# Patient Record
Sex: Female | Born: 1980 | Race: White | Hispanic: No | Marital: Married | Smoking: Former smoker
Health system: Southern US, Community
[De-identification: ages and names within clinical notes are randomized; demographics above are authoritative.]

## PROBLEM LIST (undated history)

## (undated) ENCOUNTER — Inpatient Hospital Stay (HOSPITAL_COMMUNITY): Payer: Self-pay

## (undated) DIAGNOSIS — R51 Headache: Secondary | ICD-10-CM

## (undated) DIAGNOSIS — K219 Gastro-esophageal reflux disease without esophagitis: Secondary | ICD-10-CM

## (undated) DIAGNOSIS — R519 Headache, unspecified: Secondary | ICD-10-CM

## (undated) DIAGNOSIS — F419 Anxiety disorder, unspecified: Secondary | ICD-10-CM

## (undated) DIAGNOSIS — Z8619 Personal history of other infectious and parasitic diseases: Secondary | ICD-10-CM

## (undated) DIAGNOSIS — F988 Other specified behavioral and emotional disorders with onset usually occurring in childhood and adolescence: Secondary | ICD-10-CM

## (undated) HISTORY — PX: HERNIA REPAIR: SHX51

## (undated) HISTORY — PX: THERAPEUTIC ABORTION: SHX798

## (undated) HISTORY — DX: Anxiety disorder, unspecified: F41.9

---

## 2002-01-05 ENCOUNTER — Emergency Department (HOSPITAL_COMMUNITY): Admission: EM | Admit: 2002-01-05 | Discharge: 2002-01-05 | Payer: Self-pay | Admitting: *Deleted

## 2002-08-23 ENCOUNTER — Encounter: Payer: Self-pay | Admitting: Emergency Medicine

## 2002-08-23 ENCOUNTER — Emergency Department (HOSPITAL_COMMUNITY): Admission: EM | Admit: 2002-08-23 | Discharge: 2002-08-23 | Payer: Self-pay | Admitting: Emergency Medicine

## 2003-04-16 ENCOUNTER — Emergency Department (HOSPITAL_COMMUNITY): Admission: EM | Admit: 2003-04-16 | Discharge: 2003-04-16 | Payer: Self-pay | Admitting: Emergency Medicine

## 2010-07-06 ENCOUNTER — Inpatient Hospital Stay (HOSPITAL_COMMUNITY): Admission: RE | Admit: 2010-07-06 | Discharge: 2010-07-09 | Payer: Self-pay | Admitting: Obstetrics and Gynecology

## 2010-11-28 ENCOUNTER — Inpatient Hospital Stay (HOSPITAL_COMMUNITY): Admission: AD | Admit: 2010-11-28 | Payer: Self-pay | Admitting: Obstetrics and Gynecology

## 2011-01-06 LAB — CBC
HCT: 30.2 % — ABNORMAL LOW (ref 36.0–46.0)
HCT: 37.7 % (ref 36.0–46.0)
Hemoglobin: 10.6 g/dL — ABNORMAL LOW (ref 12.0–15.0)
Hemoglobin: 13.3 g/dL (ref 12.0–15.0)
MCH: 34 pg (ref 26.0–34.0)
MCH: 34.1 pg — ABNORMAL HIGH (ref 26.0–34.0)
MCHC: 34.9 g/dL (ref 30.0–36.0)
MCHC: 35.2 g/dL (ref 30.0–36.0)
MCV: 96.8 fL (ref 78.0–100.0)
MCV: 97.5 fL (ref 78.0–100.0)
Platelets: 148 10*3/uL — ABNORMAL LOW (ref 150–400)
Platelets: 176 10*3/uL (ref 150–400)
RBC: 3.1 MIL/uL — ABNORMAL LOW (ref 3.87–5.11)
RBC: 3.89 MIL/uL (ref 3.87–5.11)
RDW: 13.2 % (ref 11.5–15.5)
RDW: 13.3 % (ref 11.5–15.5)
WBC: 10.8 10*3/uL — ABNORMAL HIGH (ref 4.0–10.5)
WBC: 11.3 10*3/uL — ABNORMAL HIGH (ref 4.0–10.5)

## 2011-01-06 LAB — SURGICAL PCR SCREEN
MRSA, PCR: NEGATIVE
Staphylococcus aureus: NEGATIVE

## 2011-01-06 LAB — RPR: RPR Ser Ql: NONREACTIVE

## 2011-11-02 ENCOUNTER — Ambulatory Visit (INDEPENDENT_AMBULATORY_CARE_PROVIDER_SITE_OTHER): Payer: BC Managed Care – PPO | Admitting: Physician Assistant

## 2011-11-02 DIAGNOSIS — F909 Attention-deficit hyperactivity disorder, unspecified type: Secondary | ICD-10-CM

## 2011-11-02 DIAGNOSIS — G4700 Insomnia, unspecified: Secondary | ICD-10-CM

## 2011-12-10 ENCOUNTER — Telehealth: Payer: Self-pay

## 2011-12-10 MED ORDER — AMPHETAMINE-DEXTROAMPHETAMINE 15 MG PO TABS: 15.0000 mg | ORAL_TABLET | Freq: Two times a day (BID) | ORAL | Status: DC

## 2011-12-10 NOTE — Telephone Encounter (Signed)
Done. Ready to pick up.  Hailey [name]  

## 2011-12-10 NOTE — Telephone Encounter (Signed)
Pt is requesting a refill on adderol

## 2011-12-11 NOTE — Telephone Encounter (Signed)
LMOM rx ready to pick up

## 2012-01-17 ENCOUNTER — Telehealth: Payer: Self-pay

## 2012-01-17 NOTE — Telephone Encounter (Signed)
Pt requesting adderall refill   [facility]  Phone [phone]

## 2012-01-18 MED ORDER — AMPHETAMINE-DEXTROAMPHETAMINE 15 MG PO TABS: 15.0000 mg | ORAL_TABLET | Freq: Two times a day (BID) | ORAL | Status: DC

## 2012-01-18 NOTE — Telephone Encounter (Signed)
Done ready for pickup  Hailey [name] 

## 2012-01-19 NOTE — Telephone Encounter (Signed)
[facility] notifying patient rx ready and in pick up drawer.

## 2012-02-23 ENCOUNTER — Telehealth: Payer: Self-pay

## 2012-02-23 NOTE — Telephone Encounter (Signed)
Pt called needs refill on prescription adderall. Please let pt know when ready to pick-up. [phone]

## 2012-02-24 MED ORDER — AMPHETAMINE-DEXTROAMPHETAMINE 15 MG PO TABS: 15.0000 mg | ORAL_TABLET | Freq: Two times a day (BID) | ORAL | Status: DC

## 2012-02-24 NOTE — Telephone Encounter (Signed)
Ok to refill but we need to have the paper chart to decide when she needs to RTC.  Only EPIC encounters are refills.  No OV

## 2012-02-24 NOTE — Telephone Encounter (Signed)
LMOM that Rx is ready for p/up 

## 2012-03-30 ENCOUNTER — Telehealth: Payer: Self-pay

## 2012-03-30 NOTE — Telephone Encounter (Signed)
Need paper chart  

## 2012-03-30 NOTE — Telephone Encounter (Signed)
.  [facility] The patient called to request refill of her Adderall 15 mg tablet Rx.  The patient would like to pick up Rx tomorrow 03/31/12.  Please call patient at [phone] when ready for pick up.

## 2012-03-31 MED ORDER — AMPHETAMINE-DEXTROAMPHETAMINE 15 MG PO TABS: 15.0000 mg | ORAL_TABLET | Freq: Two times a day (BID) | ORAL | Status: DC

## 2012-03-31 NOTE — Telephone Encounter (Signed)
Chart pulled to PA 

## 2012-03-31 NOTE — Telephone Encounter (Signed)
LMOM notifying patient rx in pick up drawer. 

## 2012-03-31 NOTE — Telephone Encounter (Signed)
Done and printed

## 2012-05-01 ENCOUNTER — Telehealth: Payer: Self-pay

## 2012-05-01 NOTE — Telephone Encounter (Signed)
PT REQUESTING REFILL ON HER ADDERALL

## 2012-05-01 NOTE — Telephone Encounter (Signed)
Please pull paper chart.  

## 2012-05-02 MED ORDER — AMPHETAMINE-DEXTROAMPHETAMINE 15 MG PO TABS: 15.0000 mg | ORAL_TABLET | Freq: Two times a day (BID) | ORAL | Status: DC

## 2012-05-02 NOTE — Telephone Encounter (Signed)
Patient's chart is at the nurses station in the phone message pile.  [id number]

## 2012-05-02 NOTE — Telephone Encounter (Signed)
Pt notified that rx is ready for pick up

## 2012-05-02 NOTE — Telephone Encounter (Signed)
Rx done and ready for pickup. Will need office visit for additional refills 

## 2012-05-15 ENCOUNTER — Ambulatory Visit (INDEPENDENT_AMBULATORY_CARE_PROVIDER_SITE_OTHER): Payer: BC Managed Care – PPO | Admitting: Physician Assistant

## 2012-05-15 VITALS — BP 102/70 | HR 68 | Temp 98.0°F | Resp 16 | Ht 67.5 in | Wt 118.0 lb

## 2012-05-15 DIAGNOSIS — R05 Cough: Secondary | ICD-10-CM

## 2012-05-15 DIAGNOSIS — J019 Acute sinusitis, unspecified: Secondary | ICD-10-CM

## 2012-05-15 DIAGNOSIS — N912 Amenorrhea, unspecified: Secondary | ICD-10-CM

## 2012-05-15 LAB — POCT URINE PREGNANCY: Preg Test, Ur: NEGATIVE

## 2012-05-15 MED ORDER — HYDROCOD POLST-CHLORPHEN POLST 10-8 MG/5ML PO LQCR: 5.0000 mL | Freq: Two times a day (BID) | ORAL | Status: DC

## 2012-05-15 MED ORDER — AMOXICILLIN 875 MG PO TABS: 875.0000 mg | ORAL_TABLET | Freq: Two times a day (BID) | ORAL | Status: AC

## 2012-05-15 NOTE — Progress Notes (Signed)
  Subjective:    Patient ID: Hailey [name], female    DOB: 04/17/1981, 31 y.o.   MRN: [id number]  HPI Pt presents to clinic with 4 day h/o sinus congestion and teeth pain on the L.  Yellow nasal drainage and cough that is keeping her up at night.  Pt is worried about pregnancy because she is having heartburn and the only other time she had heartburn was when she was pregnant.     Review of Systems  Constitutional: Negative for fever and chills.  HENT: Positive for congestion, rhinorrhea and postnasal drip. Negative for ear pain and sore throat. Sinus pressure: L>R.   Respiratory: Positive for cough. Negative for shortness of breath.        Objective:   Physical Exam  Vitals reviewed. Constitutional: She is oriented to person, place, and time. She appears well-developed and well-nourished.  HENT:  Head: Normocephalic and atraumatic.  Right Ear: External ear normal.  Left Ear: External ear normal.  Eyes: Conjunctivae are normal.  Neck: Normal range of motion. Neck supple.  Cardiovascular: Normal rate and normal heart sounds.   No murmur heard. Pulmonary/Chest: Effort normal and breath sounds normal.  Lymphadenopathy:    She has no cervical adenopathy.  Neurological: She is alert and oriented to person, place, and time.  Skin: Skin is warm and dry.  Psychiatric: She has a normal mood and affect. Her behavior is normal. Judgment and thought content normal.       Assessment & Plan:   1. Amenorrhea  POCT urine pregnancy  2. Acute sinusitis, unspecified  amoxicillin (AMOXIL) 875 MG tablet  3. Cough  chlorpheniramine-HYDROcodone (TUSSIONEX PENNKINETIC ER) 10-8 MG/5ML LQCR   Pt to continue mucinex, push fluids.  Ok to use Afrin for a couple of days for congestion.  Pt agrees and understands the above.

## 2012-05-30 ENCOUNTER — Telehealth: Payer: Self-pay

## 2012-05-30 NOTE — Telephone Encounter (Signed)
Pt returned call and she had a Pap done at [facility] with Dr Billy Coast about 6-7 months ago and she has been on Ortho Tri Cyclen Lo

## 2012-05-30 NOTE — Telephone Encounter (Signed)
Pt states we need to order her bc pills thru [facility](mail order) to save her money.She request that we call [phone] option 2 to complete  Order.   [facility] phone for pt [phone]

## 2012-05-30 NOTE — Telephone Encounter (Signed)
LMOM to CB. Ask pt if she has had a PAP w/in the last year and who has been Rxing her BCP recently. I don't see a PAP from here since 2009 or a RF either. Verify type of BCP.

## 2012-05-30 NOTE — Telephone Encounter (Signed)
We have no record of prescribing this to her in the last year.  Dr. Billy Coast is in [facility] and her last visit noted was 11/2010. Are we missing details about this? We can not refill at this time.

## 2012-05-31 ENCOUNTER — Encounter: Payer: Self-pay | Admitting: Radiology

## 2012-05-31 MED ORDER — NORGESTIM-ETH ESTRAD TRIPHASIC 0.18/0.215/0.25 MG-25 MCG PO TABS: 1.0000 | ORAL_TABLET | Freq: Every day | ORAL | Status: DC

## 2012-05-31 NOTE — Telephone Encounter (Signed)
Sent RX to [facility]

## 2012-05-31 NOTE — Telephone Encounter (Signed)
Notified pt done. Pt thanked us 

## 2012-05-31 NOTE — Telephone Encounter (Signed)
Rx done and ready to be faxed 

## 2012-05-31 NOTE — Telephone Encounter (Signed)
Patient advised, she states she will call their office, was done recently they will fax this to my attention.

## 2012-05-31 NOTE — Telephone Encounter (Signed)
Pap received from Dr Billy Coast office done 12/2011 normal, is at [facility] desk.

## 2012-06-01 ENCOUNTER — Telehealth: Payer: Self-pay

## 2012-06-01 MED ORDER — NORGESTIM-ETH ESTRAD TRIPHASIC 0.18/0.215/0.25 MG-25 MCG PO TABS: 1.0000 | ORAL_TABLET | Freq: Every day | ORAL | Status: DC

## 2012-06-01 NOTE — Telephone Encounter (Signed)
Pt called and said that [facility] told her we had sent in a Rx for only 1 mos plus RFs instead of 3 mos at a time. Checked records and we did send in only for 1 mos. Changed Rx for 90 day supply w/3 RFs.

## 2012-06-05 ENCOUNTER — Telehealth: Payer: Self-pay

## 2012-06-05 MED ORDER — AMPHETAMINE-DEXTROAMPHETAMINE 15 MG PO TABS: 15.0000 mg | ORAL_TABLET | Freq: Two times a day (BID) | ORAL | Status: DC

## 2012-06-05 NOTE — Telephone Encounter (Signed)
LMOM for pt that her request is ready for p/up 

## 2012-06-05 NOTE — Telephone Encounter (Signed)
The patient called to request refill of Adderall rx.  Please call the patient at [phone] when ready for pick up.

## 2012-06-05 NOTE — Telephone Encounter (Signed)
Done and printed

## 2012-07-06 ENCOUNTER — Encounter: Payer: Self-pay | Admitting: Physician Assistant

## 2012-07-06 ENCOUNTER — Ambulatory Visit (INDEPENDENT_AMBULATORY_CARE_PROVIDER_SITE_OTHER): Payer: BC Managed Care – PPO | Admitting: Physician Assistant

## 2012-07-06 VITALS — BP 120/64 | HR 70 | Temp 98.2°F | Resp 18 | Ht 65.0 in | Wt 117.0 lb

## 2012-07-06 DIAGNOSIS — M791 Myalgia, unspecified site: Secondary | ICD-10-CM

## 2012-07-06 DIAGNOSIS — J069 Acute upper respiratory infection, unspecified: Secondary | ICD-10-CM

## 2012-07-06 DIAGNOSIS — R110 Nausea: Secondary | ICD-10-CM

## 2012-07-06 DIAGNOSIS — F411 Generalized anxiety disorder: Secondary | ICD-10-CM

## 2012-07-06 DIAGNOSIS — R21 Rash and other nonspecific skin eruption: Secondary | ICD-10-CM

## 2012-07-06 DIAGNOSIS — IMO0001 Reserved for inherently not codable concepts without codable children: Secondary | ICD-10-CM

## 2012-07-06 DIAGNOSIS — F419 Anxiety disorder, unspecified: Secondary | ICD-10-CM

## 2012-07-06 LAB — POCT CBC
HCT, POC: 42 % (ref 37.7–47.9)
Lymph, poc: 1 (ref 0.6–3.4)
MCHC: 31.4 g/dL — AB (ref 31.8–35.4)
MCV: 93.7 fL (ref 80–97)
POC LYMPH PERCENT: 22.9 %L (ref 10–50)
RDW, POC: 13.2 %

## 2012-07-06 MED ORDER — DOXYCYCLINE HYCLATE 100 MG PO TABS: 100.0000 mg | ORAL_TABLET | Freq: Two times a day (BID) | ORAL | Status: AC

## 2012-07-06 MED ORDER — CETIRIZINE HCL 10 MG PO TABS: 10.0000 mg | ORAL_TABLET | Freq: Every day | ORAL | Status: DC

## 2012-07-06 MED ORDER — ALPRAZOLAM 0.5 MG PO TABS: 0.2500 mg | ORAL_TABLET | Freq: Two times a day (BID) | ORAL | Status: AC | PRN

## 2012-07-06 MED ORDER — ONDANSETRON HCL 4 MG PO TABS: 4.0000 mg | ORAL_TABLET | Freq: Three times a day (TID) | ORAL | Status: AC | PRN

## 2012-07-06 NOTE — Progress Notes (Signed)
Subjective:    Patient ID: Hailey [name], female    DOB: 01-Aug-1981, 31 y.o.   MRN: [id number]  HPI  Pt presents to clinic with multiple concerns 1- has had cold symptoms for 2-3 days with chills and myalgias for the 1st 2 days and then she developed sore throat.  Her chills have gotten a little better today.  She has no congestion or cough. 2- at the same time as she developed cold symptoms she started to develop a non-itchy rash on abd that has spread to arms and legs.  She noticed it prior to being outside at a golf tournament, she has had no changes to her lotions, soaps or detergents.  No one else at home has a similar rash. No tick bites. 3- flying in a month and would like something for nausea - she has used both phenergan and zofran with relief. 4- she has noticed the last 3-4 months that during her menses (week off OCP) that she is very irritable and her anxiety is high like it was years ago.  She feels depressed and angry a lot.  She is really concerned that her anxiety is coming back and she does not want to have to get back to the way she was.    Review of Systems  Constitutional: Positive for chills. Negative for fever.  HENT: Positive for sore throat. Negative for congestion, rhinorrhea and sinus pressure.   Respiratory: Negative for cough.   Skin: Positive for rash.  Psychiatric/Behavioral: Positive for agitation. The patient is nervous/anxious.        Objective:   Physical Exam  Vitals reviewed. Constitutional: She is oriented to person, place, and time. She appears well-developed and well-nourished.  HENT:  Head: Normocephalic and atraumatic.  Right Ear: External ear normal.  Left Ear: External ear normal.  Nose: Nose normal.  Mouth/Throat: Oropharynx is clear and moist.  Eyes: Conjunctivae normal are normal.  Neck: Neck supple.  Cardiovascular: Normal rate, regular rhythm and normal heart sounds.  Exam reveals no gallop.   No murmur heard. Pulmonary/Chest:  Effort normal and breath sounds normal.  Lymphadenopathy:    She has no cervical adenopathy.  Neurological: She is alert and oriented to person, place, and time.  Skin: Skin is warm and dry. Rash noted.       Maculopapular rash on torso, arms and legs and face.  No rash on back.  Some are associated with hair follicles, all blanch.  Some are excoriated. Non on palms or soles.  Psychiatric: She has a normal mood and affect. Her behavior is normal. Judgment and thought content normal.   Results for orders placed in visit on 07/06/12  POCT CBC      Component Value Range   WBC 4.2 (*) 4.6 - 10.2 K/uL   Lymph, poc 1.0  0.6 - 3.4   POC LYMPH PERCENT 22.9  10 - 50 %L   MID (cbc) 0.3  0 - 0.9   POC MID % 6.1  0 - 12 %M   POC Granulocyte 3.0  2 - 6.9   Granulocyte percent 71.0  37 - 80 %G   RBC 4.48  4.04 - 5.48 M/uL   Hemoglobin 13.2  12.2 - 16.2 g/dL   HCT, POC 40.9  81.1 - 47.9 %   MCV 93.7  80 - 97 fL   MCH, POC 29.5  27 - 31.2 pg   MCHC 31.4 (*) 31.8 - 35.4 g/dL   RDW, POC 13.2  Platelet Count, POC 166  142 - 424 K/uL   MPV 8.0  0 - 99.8 fL       Assessment & Plan:   1. URI (upper respiratory infection)  POCT CBC  2. Rash  doxycycline (VIBRA-TABS) 100 MG tablet, cetirizine (ZYRTEC) 10 MG tablet  3. Myalgia    4. Anxiety  ALPRAZolam (XANAX) 0.5 MG tablet  5. Nausea  ondansetron (ZOFRAN) 4 MG tablet   1- Pt can use mucinex - d/w pt that she may start to get more congestion, cough and cold symptoms. 2- ? Folliculitis vs viral exanthem - do not believe contagious.   3- 4- d/w pt that her anxiety seems hormonal since it only seems to occur when off OCP.  D/w pt that we could change her OCP to monophasic and do continuous cycling but pts really does not want to change her pill because she like them.  Other option to have prn Xanax and monitor the use.  Pt is interested in becoming pregnant within the year and because only 4days out of the month we do not want to start daily  medications.  Pt will be aware of increased anxiety and RTC if her Xanax use increases.  She is very aware due to her past history with anxiety.

## 2012-07-12 ENCOUNTER — Telehealth: Payer: Self-pay

## 2012-07-12 MED ORDER — AMPHETAMINE-DEXTROAMPHETAMINE 15 MG PO TABS: 15.0000 mg | ORAL_TABLET | Freq: Two times a day (BID) | ORAL | Status: DC

## 2012-07-12 NOTE — Telephone Encounter (Signed)
LMOM Rx ready to pick up. 

## 2012-07-12 NOTE — Telephone Encounter (Signed)
Refill done and printed up front.  

## 2012-07-12 NOTE — Telephone Encounter (Signed)
PT IN NEED OF HER ADDERALL. PLEASE CALL [phone]

## 2012-08-14 ENCOUNTER — Telehealth: Payer: Self-pay

## 2012-08-14 MED ORDER — AMPHETAMINE-DEXTROAMPHETAMINE 15 MG PO TABS: 15.0000 mg | ORAL_TABLET | Freq: Two times a day (BID) | ORAL | Status: DC

## 2012-08-14 NOTE — Telephone Encounter (Signed)
Patient advised.

## 2012-08-14 NOTE — Telephone Encounter (Signed)
Rx done and ready for pickup 

## 2012-08-14 NOTE — Telephone Encounter (Signed)
PT IN NEED OF HER ADDERALL. PLEASE CALL [phone] WHEN READY FOR P/U °

## 2012-09-16 ENCOUNTER — Telehealth: Payer: Self-pay

## 2012-09-16 NOTE — Telephone Encounter (Signed)
The patient called to request rx refill for Adderall.  Please call the patient at [phone] when rx ready for pick up.

## 2012-09-16 NOTE — Telephone Encounter (Signed)
Sarah, I think you are the prescriber for this.   -Keven Soucy

## 2012-09-17 MED ORDER — AMPHETAMINE-DEXTROAMPHETAMINE 15 MG PO TABS: 15.0000 mg | ORAL_TABLET | Freq: Two times a day (BID) | ORAL | Status: DC

## 2012-09-17 NOTE — Telephone Encounter (Signed)
LMOM that Rx is ready for p/up 

## 2012-09-17 NOTE — Telephone Encounter (Signed)
At tl desk 

## 2012-10-31 ENCOUNTER — Other Ambulatory Visit: Payer: Self-pay

## 2012-10-31 NOTE — Telephone Encounter (Signed)
Sarah, do you want to RF? When does pt need to f/up for ADD? I have pended the Rx if you agree to RF.

## 2012-10-31 NOTE — Telephone Encounter (Signed)
RX REFILL ADERAL 15MG  PLEASE CALL PATIENT IF THIS IS DONE OR READY FOR PICK UP. THANK YOU!

## 2012-11-02 ENCOUNTER — Telehealth: Payer: Self-pay

## 2012-11-02 MED ORDER — AMPHETAMINE-DEXTROAMPHETAMINE 15 MG PO TABS: 15.0000 mg | ORAL_TABLET | Freq: Two times a day (BID) | ORAL | Status: DC

## 2012-11-02 NOTE — Telephone Encounter (Signed)
Already done

## 2012-11-02 NOTE — Telephone Encounter (Signed)
Please pull paper chart.  

## 2012-11-02 NOTE — Telephone Encounter (Signed)
Notified pt that Rx is ready for p/up 

## 2012-11-02 NOTE — Telephone Encounter (Signed)
At [employer].  She should f/u 3/14.

## 2012-11-02 NOTE — Telephone Encounter (Signed)
Chart pulled at nurses station pa pool [phone]

## 2012-11-02 NOTE — Telephone Encounter (Signed)
Pt is requesting amphetamine-dextroamphetamine (ADDERALL) 15 MG tablet   CBN:  [phone]

## 2012-11-23 ENCOUNTER — Other Ambulatory Visit (HOSPITAL_COMMUNITY): Payer: Self-pay | Admitting: Family Medicine

## 2012-11-23 ENCOUNTER — Ambulatory Visit (HOSPITAL_COMMUNITY)
Admission: RE | Admit: 2012-11-23 | Discharge: 2012-11-23 | Disposition: A | Discharge status: Home / Self Care | Payer: BC Managed Care – PPO | Source: Ambulatory Visit | Attending: Family Medicine | Admitting: Family Medicine

## 2012-11-23 ENCOUNTER — Ambulatory Visit (INDEPENDENT_AMBULATORY_CARE_PROVIDER_SITE_OTHER): Payer: BC Managed Care – PPO | Admitting: Family Medicine

## 2012-11-23 ENCOUNTER — Ambulatory Visit: Payer: BC Managed Care – PPO

## 2012-11-23 ENCOUNTER — Telehealth: Payer: Self-pay | Admitting: *Deleted

## 2012-11-23 VITALS — BP 118/78 | HR 100 | Temp 99.0°F | Resp 16 | Ht 65.0 in | Wt 113.2 lb

## 2012-11-23 DIAGNOSIS — M79606 Pain in leg, unspecified: Secondary | ICD-10-CM

## 2012-11-23 DIAGNOSIS — M79669 Pain in unspecified lower leg: Secondary | ICD-10-CM

## 2012-11-23 DIAGNOSIS — IMO0001 Reserved for inherently not codable concepts without codable children: Secondary | ICD-10-CM

## 2012-11-23 DIAGNOSIS — M79609 Pain in unspecified limb: Secondary | ICD-10-CM

## 2012-11-23 DIAGNOSIS — M79604 Pain in right leg: Secondary | ICD-10-CM

## 2012-11-23 DIAGNOSIS — M791 Myalgia, unspecified site: Secondary | ICD-10-CM

## 2012-11-23 LAB — BASIC METABOLIC PANEL
CO2: 26 mEq/L (ref 19–32)
Calcium: 9.2 mg/dL (ref 8.4–10.5)
Glucose, Bld: 109 mg/dL — ABNORMAL HIGH (ref 70–99)
Sodium: 138 mEq/L (ref 135–145)

## 2012-11-23 MED ORDER — CYCLOBENZAPRINE HCL 5 MG PO TABS: ORAL_TABLET | ORAL | Status: DC

## 2012-11-23 NOTE — Telephone Encounter (Signed)
Advised pt per Dr. Neva Seat that she was negative for DVT and that she should follow up in 1 week

## 2012-11-23 NOTE — Progress Notes (Signed)
Subjective:    Patient ID: Hailey [name], female    DOB: September 24, 1981, 32 y.o.   MRN: [id number]  HPI Hailey [name] is a 32 y.o. female  R leg feels tense - here and there initially past 6 months., intially upper thigh - tried otc restless leg syndrome med - no relief - stretches - no relief.  Feels sensation of constant tense feeling. R calf soreness also off and on for past 6 months.  More constant past 2 weeks - wakes up with soreness now.  Ankle feels stiff at times and "pops" frequently.  More sore in leg that night if popping ankle. Occasional swelling in outside of ankle.  No recent prolonged car travel or air travel, but flew to [place] in October.  Was having leg pains prior.  No chest pain, no shortness of breath.    Quit smoking 2 years ago - prior 14 year smoker - less than 1/2ppd  Tx: multiple otc antiinflammatories and tried one Darvocet without relief last night.     Review of Systems  Constitutional: Negative for fever, chills and unexpected weight change.  Respiratory: Negative for chest tightness and shortness of breath.   Cardiovascular: Positive for leg swelling (feels tighter on right. ). Negative for chest pain.  Skin: Negative for rash.       Objective:   Physical Exam  Vitals reviewed. Constitutional: She appears well-developed and well-nourished. No distress.  HENT:  Head: Normocephalic and atraumatic.  Cardiovascular: Normal rate, regular rhythm, normal heart sounds and intact distal pulses.   Pulmonary/Chest: Effort normal and breath sounds normal. No respiratory distress.  Musculoskeletal:       Right hip: She exhibits normal range of motion (no pain with hip rom. ).       Right knee: She exhibits normal range of motion and no swelling. no tenderness found. No medial joint line, no lateral joint line, no MCL and no LCL tenderness noted.       Right ankle: She exhibits normal range of motion, no swelling and no ecchymosis. no tenderness. No  lateral malleolus and no medial malleolus tenderness found. Achilles tendon normal.       Legs: Neurological: She is alert.  nvi distally, no rash.   [facility] reading (PRIMARY) by  Dr. Neva Seat: R femur, R tib fib - no bony findings appreciated    Assessment & Plan:  Hailey [name] is a 31 y.o. female 1. Right leg pain  DG Femur Right, DG Tibia/Fibula Right, US Venous Img Lower Unilateral Right  2. Myalgia  CK, US Venous Img Lower Unilateral Right, cyclobenzaprine (FLEXERIL) 5 MG tablet  3. Muscle pain  Basic metabolic panel, US Venous Img Lower Unilateral Right, cyclobenzaprine (FLEXERIL) 5 MG tablet  4. Calf pain  US Venous Img Lower Unilateral Right   Longstanding R leg pain - unable to pinpoint area on exam. Subjective calf swelling/tightness and 1 cm discrepancy - doppler ordered.  Preliminary reading WNL, no DVT.  DDX includes RLS, vs electrolyte imbalance with cramping. Will check CK, lytes, and trial of flexeril qhs.  Recheck in next week if not improved. Consider NCS to determine if back related. rtc precautions.   Patient Instructions   Driving directions to [facility] 3D2D  [phone]  - more info    [address]  [place], [place] [address]     1. Head south on [address] toward [address]      0.5 mi    2.  Sharp left onto [address]      0.6 mi    3. Turn left onto the [employer] E ramp      0.2 mi    4. Merge onto [address] E      3.0 mi    5. Continue straight to stay on [employer] W E      0.4 mi    6. Slight left to stay on [employer] W E      1.2 mi    7. Turn right onto [address]      0.1 mi    8. Turn left onto [employer] [address]      361 ft    9. Take the 1st left onto [facility]  Destination will be on the right    Driving directions to [facility] 3D2D  [phone]  - more info    [address]  [place], [place] [address]     1. Head north on [address] toward [employer]      344 ft    2. Turn right onto  [employer] [address]      0.3 mi    3. Slight left to stay on [address]      1.7 mi    4. Turn left onto [address]  Destination will be on the right     0.6 mi     [facility]  [address] [place]   Driving directions to [address] [place], [place], [place] [address] 3D2D  - more info    [address]  [place], [place] [address]     1. Head south on [address] toward [address]      0.5 mi    2. Sharp left onto [address]      0.6 mi    3. Turn left onto the [employer] E ramp      0.2 mi    4. Merge onto [address] E      3.0 mi    5. Continue straight to stay on [employer] [address] W E      0.4 mi    6. Slight left to stay on [facility]  Destination will be on the right     1.0 mi     [address] [place], [place] [address]   Driving directions to [facility] 3D2D  [phone]  - more info    [address]  [place], [place] [address]     1. Head south on [address] toward [address]      0.5 mi    2. Sharp left onto [address]      0.6 mi    3. Turn left onto the [employer] E ramp      0.2 mi    4. Merge onto [employer] [address] E      3.0 mi    5. Slight right toward [employer] (signs for [address])      0.2 mi    6. Take the ramp to [employer]      338 ft    7. Turn left onto [address]      0.3 mi    8. Turn left onto [facility] [address]  Destination will be on the right     0.2 mi  [facility]  [address]   Driving directions to [facility] 3D2D  - more info    [address]  [place], [place] [address]     1. Head south on [address] toward [address]      0.7 mi    2. Turn left onto [employer]      0.4 mi    3. Take the 3rd right onto [address] W      1.1 mi    4. Take the [address] ramp to [facility]      0.2 mi    5. Merge onto [address]      3.7 mi    6. Take exit [address] for [address] toward [place]      0.3 mi    7. Keep  left at the fork, follow signs for Airport      381 ft    8. Keep left at the fork, follow signs for [facility] S/[place]      302 ft    9. Turn left onto [address]      2.6 mi    10. Turn right onto [employer] will be on the left     0.2 mi     [place] [employer]   Your should receive a call or letter about your lab results within the next week to 10 days. alleve or ibuprofen over the counter as needed. Muscle relaxant at bedtime as needed.  Recheck in next 1 week. Return to the clinic or go to the nearest emergency room if any of your symptoms worsen or new symptoms occur.

## 2012-11-23 NOTE — Progress Notes (Signed)
*  PRELIMINARY RESULTS* Vascular Ultrasound Right lower extremity venous duplex has been completed.  Preliminary findings: Right:  No evidence of DVT, superficial thrombosis, or Baker's cyst.   Farrel Demark, RDMS, RVT 11/23/2012, 3:31 PM

## 2012-11-23 NOTE — Patient Instructions (Addendum)
Driving directions to [facility] 3D2D  [phone]  - more info    [address]  [place], [place] [address]     1. Head south on [address] toward [address]      0.5 mi    2. Sharp left onto [address]      0.6 mi    3. Turn left onto the [address] ramp      0.2 mi    4. Merge onto [address] E      3.0 mi    5. Continue straight to stay on [address] E      0.4 mi    6. Slight left to stay on [address] E      1.2 mi    7. Turn right onto [address]      0.1 mi    8. Turn left onto [address]      361 ft    9. Take the 1st left onto [address]  Destination will be on the right    Driving directions to [facility] 3D2D  [phone]  - more info    [address]  [place], [place] [address]     1. Head north on [address] toward [address]      344 ft    2. Turn right onto [address]      0.3 mi    3. Slight left to stay on [address]      1.7 mi    4. Turn left onto [address]  Destination will be on the right     0.6 mi     [facility]  [address]   Driving directions to [address], [place], [place] [address] 3D2D  - more info    [address]  [place], [place] [address]     1. Head south on [address] toward [address]      0.5 mi    2. Sharp left onto [address]      0.6 mi    3. Turn left onto the [address] ramp      0.2 mi    4. Merge onto [address] E      3.0 mi    5. Continue straight to stay on [address] E      0.4 mi    6. Slight left to stay on [address] E  Destination will be on the right     1.0 mi     [address]  [place], [place] [address]   Driving directions to [facility] 3D2D  [phone]  - more info    [address]  [place], [place] [address]     1. Head south on [address] toward [address]      0.5 mi    2. Sharp left onto [address]      0.6 mi    3. Turn left onto the [address] ramp      0.2 mi    4. Merge onto  [address] E      3.0 mi    5. Slight right toward [address] (signs for [address])      0.2 mi    6. Take the ramp to [address]      338 ft    7. Turn left onto [employer] [address]      0.3 mi    8. Turn left onto [facility] [address]  Destination will be on the right     0.2 mi     [facility]  [address]   Driving directions to [facility] 3D2D  - more info    [address]  [place], [place] [address]     1. Head south on [address] toward [address]      0.7 mi    2. Turn left onto [employer]      0.4 mi    3. Take the 3rd right onto [facility] [address] W      1.1 mi    4. Take the [address] ramp to [facility]      0.2 mi    5. Merge onto [address]      3.7 mi    6. Take exit [address] for [address] toward [place]      0.3 mi    7. Keep left at the fork, follow signs for Airport      381 ft    8. Keep left at the fork, follow signs for [facility] S/[place]      302 ft    9. Turn left onto [address]      2.6 mi    10. Turn right onto [employer] will be on the left     0.2 mi     [place] [employer]   Your should receive a call or letter about your lab results within the next week to 10 days. alleve or ibuprofen over the counter as needed. Muscle relaxant at bedtime as needed.  Recheck in next 1 week. Return to the clinic or go to the nearest emergency room if any of your symptoms worsen or new symptoms occur.

## 2012-12-07 ENCOUNTER — Telehealth: Payer: Self-pay

## 2012-12-07 NOTE — Telephone Encounter (Signed)
Please advise 

## 2012-12-07 NOTE — Telephone Encounter (Signed)
Needs rf amphetamine-dextroamphetamine (ADDERALL) 15 MG tablet [phone]

## 2012-12-09 ENCOUNTER — Telehealth: Payer: Self-pay

## 2012-12-09 NOTE — Telephone Encounter (Signed)
PT STATES SHE CALLED 2 DAYS AGO ABT HAVING AN ADDERALL RX REFILLED. SAYS SHE HAS RUN OUT COMPLETLEY

## 2012-12-10 MED ORDER — AMPHETAMINE-DEXTROAMPHETAMINE 15 MG PO TABS: 15.0000 mg | ORAL_TABLET | Freq: Two times a day (BID) | ORAL | Status: DC

## 2012-12-10 NOTE — Telephone Encounter (Signed)
At Tl desk 

## 2012-12-10 NOTE — Telephone Encounter (Signed)
At desk.

## 2012-12-11 NOTE — Telephone Encounter (Signed)
I do not see this Rx, it is not in the box, did you sign it?

## 2012-12-12 NOTE — Telephone Encounter (Signed)
Pt picked up.

## 2012-12-21 ENCOUNTER — Telehealth: Payer: Self-pay

## 2012-12-21 ENCOUNTER — Ambulatory Visit (INDEPENDENT_AMBULATORY_CARE_PROVIDER_SITE_OTHER): Payer: BC Managed Care – PPO | Admitting: Physician Assistant

## 2012-12-21 DIAGNOSIS — F411 Generalized anxiety disorder: Secondary | ICD-10-CM

## 2012-12-21 MED ORDER — ALPRAZOLAM 0.5 MG PO TABS: 0.5000 mg | ORAL_TABLET | Freq: Every evening | ORAL | Status: DC | PRN

## 2012-12-21 MED ORDER — ONDANSETRON HCL 4 MG PO TABS: 4.0000 mg | ORAL_TABLET | Freq: Three times a day (TID) | ORAL | Status: DC | PRN

## 2012-12-21 NOTE — Telephone Encounter (Signed)
PT IS LEAVING FOR [place] THIS AFTERNOON AND MEANT TO CALL EARLIER FOR SOME ANXIETY AND NAUSEA MEDICINE SHE USUALLY GETS WHEN SHE TRAVELS PLEASE CALL [phone]. IS HOPING SHE DOESN'T HAVE TO COME IN BUT WILL IF NEEDED    [facility] [address]

## 2012-12-21 NOTE — Progress Notes (Signed)
   [address] [place] [address]   Phone [phone]  Subjective:    Patient ID: Hailey [name], female    DOB: 04-Mar-1981, 32 y.o.   MRN: [id number]  HPI Pt presents to clinic because she is flying today and gets very anxious and nauseated before flights.  In the past I have given her Xanax and Zofran and that works well for her.  She has been using Xanax for anxiety around her menses and it has been working well.  She has used #30 since 9/13.   Review of Systems     Objective:   Physical Exam  Vitals reviewed. Constitutional: She is oriented to person, place, and time. She appears well-developed and well-nourished.  HENT:  Head: Normocephalic and atraumatic.  Right Ear: External ear normal.  Left Ear: External ear normal.  Pulmonary/Chest: Effort normal.  Neurological: She is alert and oriented to person, place, and time.  Skin: Skin is warm and dry.  Psychiatric: She has a normal mood and affect. Her behavior is normal. Judgment and thought content normal.       Assessment & Plan:  Anxiety state, unspecified - Plan: ALPRAZolam (XANAX) 0.5 MG tablet, ondansetron (ZOFRAN) 4 MG tablet

## 2012-12-21 NOTE — Telephone Encounter (Signed)
I think patient will require office visit for this. I called her to verify what she is in need of. Left message for her to call me back.

## 2012-12-24 NOTE — Telephone Encounter (Signed)
She must have already left town. She is not returning my calls

## 2013-01-10 ENCOUNTER — Telehealth: Payer: Self-pay

## 2013-01-10 NOTE — Telephone Encounter (Signed)
Refill amphetamine-dextroamphetamine (ADDERALL) 15 MG tablet   [phone]

## 2013-01-11 MED ORDER — AMPHETAMINE-DEXTROAMPHETAMINE 15 MG PO TABS: 15.0000 mg | ORAL_TABLET | Freq: Two times a day (BID) | ORAL | Status: DC

## 2013-01-11 NOTE — Telephone Encounter (Signed)
Patient advised Rx ready for pick up.

## 2013-01-11 NOTE — Telephone Encounter (Signed)
At T[facility] desk for pick-up 

## 2013-02-18 ENCOUNTER — Telehealth: Payer: Self-pay

## 2013-02-18 NOTE — Telephone Encounter (Signed)
You gave this to her last, when do you need to see her?

## 2013-02-18 NOTE — Telephone Encounter (Signed)
Pt is calling because she is needing a refill on her Adderall Call back number is [phone]

## 2013-02-18 NOTE — Telephone Encounter (Signed)
i don't see an ov w/ me since epic began ?someone else prescribed

## 2013-02-19 MED ORDER — AMPHETAMINE-DEXTROAMPHETAMINE 15 MG PO TABS: 15.0000 mg | ORAL_TABLET | Freq: Two times a day (BID) | ORAL | Status: DC

## 2013-02-19 NOTE — Telephone Encounter (Signed)
Called her to advise Rx at front desk for pick up

## 2013-02-19 NOTE — Telephone Encounter (Signed)
If sarah gave it to her in the last y she can decide For me, I'd need to see her again

## 2013-02-19 NOTE — Telephone Encounter (Signed)
Sorry, was Hailey [name]. Please advise.

## 2013-02-19 NOTE — Telephone Encounter (Signed)
At TL desk.

## 2013-03-26 ENCOUNTER — Telehealth: Payer: Self-pay

## 2013-03-26 NOTE — Telephone Encounter (Signed)
Pended please advise.  

## 2013-03-26 NOTE — Telephone Encounter (Signed)
Pt would like a refill on adderall. °Best# [phone] °

## 2013-03-27 MED ORDER — AMPHETAMINE-DEXTROAMPHETAMINE 15 MG PO TABS: 15.0000 mg | ORAL_TABLET | Freq: Two times a day (BID) | ORAL | Status: DC

## 2013-03-27 NOTE — Telephone Encounter (Signed)
Patient advised.

## 2013-03-27 NOTE — Telephone Encounter (Signed)
Ready for pick up

## 2013-04-21 ENCOUNTER — Other Ambulatory Visit: Payer: Self-pay | Admitting: Physician Assistant

## 2013-04-28 ENCOUNTER — Telehealth: Payer: Self-pay

## 2013-04-28 NOTE — Telephone Encounter (Signed)
PT NEEDS A REFILL ON HER ADDERALL [phone]

## 2013-04-29 MED ORDER — AMPHETAMINE-DEXTROAMPHETAMINE 15 MG PO TABS: 15.0000 mg | ORAL_TABLET | Freq: Two times a day (BID) | ORAL | Status: DC

## 2013-04-29 NOTE — Telephone Encounter (Signed)
Pended please advise.  

## 2013-04-29 NOTE — Telephone Encounter (Signed)
At [facility] desk ready for pick-up.

## 2013-04-29 NOTE — Telephone Encounter (Signed)
Patient calling again about her Adderrall refill.  [phone]

## 2013-04-30 NOTE — Telephone Encounter (Signed)
lmom that rx is ready for pickup. rx is in pickup drawer at 102. 

## 2013-06-04 ENCOUNTER — Telehealth: Payer: Self-pay

## 2013-06-04 NOTE — Telephone Encounter (Signed)
Pended please advise.  

## 2013-06-04 NOTE — Telephone Encounter (Signed)
PT IN NEED OF HER ADDERALL. PLEASE CALL [phone] WHEN READY FOR P/U

## 2013-06-06 MED ORDER — AMPHETAMINE-DEXTROAMPHETAMINE 15 MG PO TABS: 15.0000 mg | ORAL_TABLET | Freq: Two times a day (BID) | ORAL | Status: DC

## 2013-06-06 NOTE — Telephone Encounter (Signed)
Rx printed  Meds ordered this encounter  Medications  . amphetamine-dextroamphetamine (ADDERALL) 15 MG tablet    Sig: Take 1 tablet (15 mg total) by mouth 2 (two) times daily.    Dispense:  60 tablet    Refill:  0    Order Specific Question:  Supervising Provider    Answer:  DOOLITTLE, ROBERT P [[id number]]

## 2013-06-06 NOTE — Telephone Encounter (Signed)
Pt is checking on status of the adderall request

## 2013-06-07 NOTE — Telephone Encounter (Signed)
LMOM Rx ready to pick up. 

## 2013-06-25 ENCOUNTER — Ambulatory Visit (INDEPENDENT_AMBULATORY_CARE_PROVIDER_SITE_OTHER): Payer: BC Managed Care – PPO | Admitting: Physician Assistant

## 2013-06-25 VITALS — BP 118/68 | HR 88 | Temp 97.7°F | Resp 19

## 2013-06-25 DIAGNOSIS — R110 Nausea: Secondary | ICD-10-CM

## 2013-06-25 DIAGNOSIS — F411 Generalized anxiety disorder: Secondary | ICD-10-CM

## 2013-06-25 LAB — POCT CBC
HCT, POC: 45 % (ref 37.7–47.9)
Hemoglobin: 14.9 g/dL (ref 12.2–16.2)
Lymph, poc: 2.6 (ref 0.6–3.4)
MCH, POC: 30.2 pg (ref 27–31.2)
MCHC: 33.1 g/dL (ref 31.8–35.4)
POC Granulocyte: 9.1 — AB (ref 2–6.9)
POC LYMPH PERCENT: 21.3 %L (ref 10–50)
RDW, POC: 13.4 %
WBC: 12.1 10*3/uL — AB (ref 4.6–10.2)

## 2013-06-25 LAB — COMPREHENSIVE METABOLIC PANEL
ALT: 18 U/L (ref 0–35)
BUN: 20 mg/dL (ref 6–23)
CO2: 24 mEq/L (ref 19–32)
Calcium: 9.9 mg/dL (ref 8.4–10.5)
Chloride: 101 mEq/L (ref 96–112)
Creat: 0.63 mg/dL (ref 0.50–1.10)
Glucose, Bld: 147 mg/dL — ABNORMAL HIGH (ref 70–99)
Total Bilirubin: 1.6 mg/dL — ABNORMAL HIGH (ref 0.3–1.2)

## 2013-06-25 LAB — GLUCOSE, POCT (MANUAL RESULT ENTRY): POC Glucose: 153 mg/dl — AB (ref 70–99)

## 2013-06-25 MED ORDER — PROMETHAZINE HCL 25 MG PO TABS: 25.0000 mg | ORAL_TABLET | Freq: Three times a day (TID) | ORAL | Status: DC | PRN

## 2013-06-25 MED ORDER — ALPRAZOLAM 0.5 MG PO TABS: 0.5000 mg | ORAL_TABLET | Freq: Every evening | ORAL | Status: DC | PRN

## 2013-06-25 MED ORDER — PROMETHAZINE HCL 25 MG/ML IJ SOLN
25.0000 mg | Freq: Four times a day (QID) | INTRAMUSCULAR | Status: DC | PRN
  Administered 2013-06-25: 25 mg via INTRAMUSCULAR

## 2013-06-25 MED ORDER — DICYCLOMINE HCL 10 MG PO CAPS: 10.0000 mg | ORAL_CAPSULE | Freq: Three times a day (TID) | ORAL | Status: DC

## 2013-06-25 MED ORDER — GI COCKTAIL ~~LOC~~
30.0000 mL | Freq: Once | ORAL | Status: AC
  Administered 2013-06-25: 30 mL via ORAL

## 2013-06-25 MED ORDER — ONDANSETRON HCL 4 MG/2ML IJ SOLN
4.0000 mg | Freq: Once | INTRAMUSCULAR | Status: AC
  Administered 2013-06-25: 4 mg via INTRAVENOUS

## 2013-06-25 NOTE — Progress Notes (Signed)
Subjective:    Patient ID: Hailey [name], female    DOB: 1981-10-11, 32 y.o.   MRN: [id number]  HPI I was called to the room urgently to assess this patient who complained on nausea/vomiting, severe muscle cramping and was possibly non-responsive.    I found her curled on her LEFT side on the exam table, crying and gagging, complaining of severe cramping in her hands, feet and stomach.  Missed a COC yesterday, took it about 12 hours late.  Awoke about 6 am today with severe nausea/vomiting, 2 episodes of diarrhea.  Historically, nausea and vomiting becomes intractable and she can't stop.  A number of years ago this happened and she became hypokalemic and had severe cramping.  This morning, as her symptoms persisted, her anxiety kicked in as she became afraid that the cramping would begin.  No unusual foods yesterday.  Last meal was a salad last evening.  It tasted fine.  No known sick contacts. No new medication.  Review of Systems As above.    Objective:   Physical Exam Blood pressure 90/60, pulse 84, temperature 97.7 F (36.5 C), temperature source Oral, resp. rate 19, SpO2 100.00%. There is no weight on file to calculate BMI. Well-developed, well nourished WF who is awake, alert and oriented, in obvious discomfort.  Frequently changes positions, crying initially.  Later relaxed, stopped crying, but continued to belch and gag.  She repeatedly induced emesis with her fingers ("it feels better after I vomit.") HEENT: [place]/AT, sclera and conjunctiva are clear.   Neck: supple, non-tender, no lymphadenopathy, thyromegaly. Heart: RRR, no murmur Lungs: normal effort, CTA Abdomen: normo-active bowel sounds, supple, diffusely tender, no mass or organomegaly. Extremities: no cyanosis, clubbing or edema. Initially, her fingers and toes are curled in and tense.  After the first 500 ml of NS IV, they relaxed and had FROM without pain. Skin: warm and dry without rash. Psychologic: good mood and  appropriate affect, normal speech and behavior.  IV started before exam performed, NS wide open.  IV Zofran 4 mg, which provided some reduced nausea, but patient complained of persistent abdominal cramping.  PO GI cocktail helped that, the phenergan 25 mg IM to further reduce nausea.  IV d/c'd after 2 L.  Results for orders placed in visit on 06/25/13  POCT CBC      Result Value Range   WBC 12.1 (*) 4.6 - 10.2 K/uL   Lymph, poc 2.6  0.6 - 3.4   POC LYMPH PERCENT 21.3  10 - 50 %L   MID (cbc) 0.4  0 - 0.9   POC MID % 3.2  0 - 12 %M   POC Granulocyte 9.1 (*) 2 - 6.9   Granulocyte percent 75.5  37 - 80 %G   RBC 4.93  4.04 - 5.48 M/uL   Hemoglobin 14.9  12.2 - 16.2 g/dL   HCT, POC 29.5  62.1 - 47.9 %   MCV 91.3  80 - 97 fL   MCH, POC 30.2  27 - 31.2 pg   MCHC 33.1  31.8 - 35.4 g/dL   RDW, POC 30.8     Platelet Count, POC 295  142 - 424 K/uL   MPV 8.8  0 - 99.8 fL  GLUCOSE, POCT (MANUAL RESULT ENTRY)      Result Value Range   POC Glucose 153 (*) 70 - 99 mg/dl       Assessment & Plan:  Nausea - Plan: ondansetron (ZOFRAN) injection 4 mg, POCT CBC, POCT glucose (  manual entry), Comprehensive metabolic panel, gi cocktail (Maalox,Lidocaine,Donnatal), promethazine (PHENERGAN) injection 25 mg, promethazine (PHENERGAN) 25 MG tablet, dicyclomine (BENTYL) 10 MG capsule  Anxiety state, unspecified - Plan: ALPRAZolam (XANAX) 0.5 MG tablet   Suspect this was due to two doses of COC closer together than usual, exacerbated by a tendency toward severe nausea and underlying anxiety.  Rest.  Hydrate. Advance diet as tolerated.  Await CMET.  RTC if symptoms worsen/persist.  Fernande Bras, PA-C Physician Assistant-Certified [facility]

## 2013-06-25 NOTE — Patient Instructions (Addendum)
Rest. Drink fluids.  Ice chips or popsicles can help.

## 2013-07-05 ENCOUNTER — Other Ambulatory Visit: Payer: Self-pay | Admitting: Physician Assistant

## 2013-07-05 NOTE — Telephone Encounter (Signed)
Hailey [name], it looks like that last time we sent in Rx to [facility], we advised on Rx OV needed for more. Chelle has seen her since but not for med refill. Do you want to give any RFs?

## 2013-07-08 ENCOUNTER — Telehealth: Payer: Self-pay

## 2013-07-08 NOTE — Telephone Encounter (Signed)
Pt requesting adderall refill  Best phone [phone]

## 2013-07-09 MED ORDER — AMPHETAMINE-DEXTROAMPHETAMINE 15 MG PO TABS: 15.0000 mg | ORAL_TABLET | Freq: Two times a day (BID) | ORAL | Status: DC

## 2013-07-09 NOTE — Telephone Encounter (Signed)
Per Amy I let patient know she would have to come in for a visit before getting a refill. Patient would like to know if she can get a few days of adderall so she doesn't miss any days before she can come in to make an appointment.

## 2013-07-09 NOTE — Telephone Encounter (Signed)
There is rx ready for her, Hailey [name] misunderstood me. My computer was not working properly when patient called, and I was unable to take her call because I was trying to call and put in yet another ticket. Patient advised to pick up Rx. She states Hailey [name] is her PCP and she is transferred to make an appt.

## 2013-07-09 NOTE — Telephone Encounter (Signed)
I've printed this prescription.  Please ask the patient who she considers her PCP.  Future prescriptions need to come from that person only.  I see that she's been here for acute things (I saw her most recently), but her ADD hasn't been addressed in over a year.  We need her to schedule with her PCP to discuss her ADD, treatment and follow-up plan for her next prescription.

## 2013-07-09 NOTE — Telephone Encounter (Signed)
Please advise 

## 2013-07-09 NOTE — Telephone Encounter (Signed)
Left message for her to call me back. 

## 2013-08-14 ENCOUNTER — Telehealth: Payer: Self-pay

## 2013-08-14 MED ORDER — AMPHETAMINE-DEXTROAMPHETAMINE 15 MG PO TABS: 15.0000 mg | ORAL_TABLET | Freq: Two times a day (BID) | ORAL | Status: DC

## 2013-08-14 NOTE — Telephone Encounter (Signed)
Yes I will give her another month.

## 2013-08-14 NOTE — Telephone Encounter (Signed)
PT WOULD LIKE TO KNOW IF SARAH WOULD GIVE HER ENOUGH ADDERALL UNTIL HER APPT IN November PLEASE CALL [phone] WHEN READY FOR P/U

## 2013-08-14 NOTE — Telephone Encounter (Signed)
We have already extended one month to her. Please advise.

## 2013-08-15 NOTE — Telephone Encounter (Signed)
Patient advised.

## 2013-08-21 ENCOUNTER — Encounter (HOSPITAL_COMMUNITY): Payer: Self-pay | Admitting: Emergency Medicine

## 2013-08-21 ENCOUNTER — Emergency Department (HOSPITAL_COMMUNITY)
Admission: EM | Admit: 2013-08-21 | Discharge: 2013-08-21 | Disposition: A | Discharge status: Home / Self Care | Payer: BC Managed Care – PPO | Attending: Emergency Medicine | Admitting: Emergency Medicine

## 2013-08-21 ENCOUNTER — Ambulatory Visit (INDEPENDENT_AMBULATORY_CARE_PROVIDER_SITE_OTHER): Payer: BC Managed Care – PPO | Admitting: Family Medicine

## 2013-08-21 VITALS — BP 112/70 | HR 90 | Temp 98.0°F | Resp 16 | Ht 65.5 in | Wt 112.2 lb

## 2013-08-21 DIAGNOSIS — K921 Melena: Secondary | ICD-10-CM | POA: Insufficient documentation

## 2013-08-21 DIAGNOSIS — E860 Dehydration: Secondary | ICD-10-CM

## 2013-08-21 DIAGNOSIS — R1110 Vomiting, unspecified: Secondary | ICD-10-CM

## 2013-08-21 DIAGNOSIS — R197 Diarrhea, unspecified: Secondary | ICD-10-CM

## 2013-08-21 DIAGNOSIS — R112 Nausea with vomiting, unspecified: Secondary | ICD-10-CM

## 2013-08-21 DIAGNOSIS — F411 Generalized anxiety disorder: Secondary | ICD-10-CM | POA: Insufficient documentation

## 2013-08-21 DIAGNOSIS — R1084 Generalized abdominal pain: Secondary | ICD-10-CM | POA: Insufficient documentation

## 2013-08-21 DIAGNOSIS — R109 Unspecified abdominal pain: Secondary | ICD-10-CM

## 2013-08-21 DIAGNOSIS — Z79899 Other long term (current) drug therapy: Secondary | ICD-10-CM | POA: Insufficient documentation

## 2013-08-21 DIAGNOSIS — Z3202 Encounter for pregnancy test, result negative: Secondary | ICD-10-CM | POA: Insufficient documentation

## 2013-08-21 DIAGNOSIS — Z87891 Personal history of nicotine dependence: Secondary | ICD-10-CM | POA: Insufficient documentation

## 2013-08-21 LAB — CBC WITH DIFFERENTIAL/PLATELET
Basophils Absolute: 0 10*3/uL (ref 0.0–0.1)
Basophils Relative: 0 % (ref 0–1)
Eosinophils Absolute: 0 10*3/uL (ref 0.0–0.7)
Eosinophils Relative: 0 % (ref 0–5)
HCT: 37 % (ref 36.0–46.0)
MCHC: 35.7 g/dL (ref 30.0–36.0)
MCV: 84.5 fL (ref 78.0–100.0)
Monocytes Absolute: 0.2 10*3/uL (ref 0.1–1.0)
Neutro Abs: 11.1 10*3/uL — ABNORMAL HIGH (ref 1.7–7.7)
Platelets: 180 10*3/uL (ref 150–400)
RDW: 12.6 % (ref 11.5–15.5)

## 2013-08-21 LAB — COMPREHENSIVE METABOLIC PANEL
AST: 17 U/L (ref 0–37)
Albumin: 3.5 g/dL (ref 3.5–5.2)
Alkaline Phosphatase: 55 U/L (ref 39–117)
Calcium: 8.1 mg/dL — ABNORMAL LOW (ref 8.4–10.5)
Creatinine, Ser: 0.45 mg/dL — ABNORMAL LOW (ref 0.50–1.10)
GFR calc Af Amer: >90 mL/min (ref >90)
GFR calc non Af Amer: >90 mL/min (ref >90)
Sodium: 139 mEq/L (ref 135–145)
Total Bilirubin: 2 mg/dL — ABNORMAL HIGH (ref 0.3–1.2)

## 2013-08-21 LAB — URINALYSIS, ROUTINE W REFLEX MICROSCOPIC
Bilirubin Urine: NEGATIVE
Ketones, ur: 40 mg/dL — AB
Nitrite: NEGATIVE
Protein, ur: 30 mg/dL — AB
Urobilinogen, UA: 0.2 mg/dL (ref 0.0–1.0)

## 2013-08-21 LAB — LIPASE, BLOOD: Lipase: 11 U/L (ref 11–59)

## 2013-08-21 LAB — URINE MICROSCOPIC-ADD ON

## 2013-08-21 LAB — POCT CBC
HCT, POC: 45.3 % (ref 37.7–47.9)
Hemoglobin: 14.8 g/dL (ref 12.2–16.2)
Lymph, poc: 1.4 (ref 0.6–3.4)
MCH, POC: 30 pg (ref 27–31.2)
MCHC: 32.7 g/dL (ref 31.8–35.4)
MCV: 91.9 fL (ref 80–97)
RBC: 4.93 M/uL (ref 4.04–5.48)
WBC: 13.7 10*3/uL — AB (ref 4.6–10.2)

## 2013-08-21 LAB — POCT PREGNANCY, URINE: Preg Test, Ur: NEGATIVE

## 2013-08-21 MED ORDER — SODIUM CHLORIDE 0.9 % IV BOLUS (SEPSIS)
1000.0000 mL | Freq: Once | INTRAVENOUS | Status: AC
  Administered 2013-08-21: 1000 mL via INTRAVENOUS

## 2013-08-21 MED ORDER — ONDANSETRON HCL 4 MG/2ML IJ SOLN
4.0000 mg | Freq: Once | INTRAMUSCULAR | Status: AC
  Administered 2013-08-21: 4 mg via INTRAVENOUS
  Filled 2013-08-21: qty 2

## 2013-08-21 MED ORDER — LORAZEPAM 2 MG/ML IJ SOLN
1.0000 mg | Freq: Once | INTRAMUSCULAR | Status: AC
  Administered 2013-08-21: 1 mg via INTRAVENOUS
  Filled 2013-08-21: qty 1

## 2013-08-21 MED ORDER — ONDANSETRON HCL 4 MG/2ML IJ SOLN: 4.0000 mg | Freq: Once | INTRAMUSCULAR | Status: DC

## 2013-08-21 MED ORDER — ONDANSETRON HCL 4 MG PO TABS: 4.0000 mg | ORAL_TABLET | Freq: Three times a day (TID) | ORAL | Status: DC | PRN

## 2013-08-21 NOTE — ED Notes (Signed)
To ED via [facility] from [facility] for further eval of n/v/d. UCC gave pt 25mg  IM Phenergan. Per EMS, pt was sticking finger down throat to 'get it out'.

## 2013-08-21 NOTE — ED Notes (Signed)
Pt states that since 0500 she has been having diarrhea and vomiting. Pt sates she has vomited 50 times and had diarrhea 50 times. Pt complains of tingling. Pt denies eating today stating the last time she ate was 2300 last night. EMS stated she became anxious at the Urgent Care. Pt states that the has had 1.5 bottles of water. Pt is unsure about how much rine she has put out. Denies dysuria. Denies vaginal pain or discharge.

## 2013-08-21 NOTE — ED Notes (Signed)
Pt is complaining of numbness and tingling in the fingertips with its spreading up her stomach and to her chest. Pt states she can't breathe and it "feels like the lungs are shrivling up". Upon auscultation lungs sounds were clear and oxygen saturation was at 100%.

## 2013-08-21 NOTE — Progress Notes (Addendum)
[facility] [address] [place] [address] [phone]  Date:  08/21/2013   Name:  Hailey [name]   DOB:  [birth year]   MRN:  [id number]  PCP:  No primary provider on file.    Chief Complaint: Diarrhea   History of Present Illness:  Hailey [name] is a 32 y.o. very pleasant female patient who presents with the following:  Here today with a GI illness.   She has had a lot of diarrhea today.  Drinking water per her mother's report but not eating.  She has vomited several times as well She is not aware of any sick contacts.    She did take a xanax and a bentyl so far today.   She has seen some bright red blood in her stool.  Both in the bowel and on the paper.   Her mother brought her here today.   She states that "my potassium is getting low.  I can tell because my fingers are cramping up."   She had a similar episode about 2 months ago.  Per note PA-C was called urgently to see her because she seemed to be unresponsive.  She was found to be crying and gagging, and inducing vomiting.  She was treated with IVF and anti- emetics and was able to go home after 2 liters of IV fluids  She notes that she ate a pizza last night which seems to have made her ill in the past.  However her husband ate the same thing and he is feeling ok today  States she has not had any phenergan in about one week  Wt Readings from Last 3 Encounters:  08/21/13 112 lb 3.2 oz (50.894 kg)  11/23/12 113 lb 3.2 oz (51.347 kg)  07/06/12 117 lb (53.071 kg)     There are no active problems to display for this patient.   Past Medical History  Diagnosis Date  . Anxiety     Past Surgical History  Procedure Laterality Date  . Hernia repair      History  Substance Use Topics  . Smoking status: Former Smoker    Types: Cigarettes    Quit date: 11/12/2008  . Smokeless tobacco: Never Used  . Alcohol Use: 1.0 oz/week    2 drink(s) per week    No family history on  file.  No Known Allergies  Medication list has been reviewed and updated.  Current Outpatient Prescriptions on File Prior to Visit  Medication Sig Dispense Refill  . ALPRAZolam (XANAX) 0.5 MG tablet Take 1-2 tablets (0.5-1 mg total) by mouth at bedtime as needed for sleep.  30 tablet  0  . amphetamine-dextroamphetamine (ADDERALL) 15 MG tablet Take 1 tablet (15 mg total) by mouth 2 (two) times daily.  60 tablet  0  . dicyclomine (BENTYL) 10 MG capsule Take 1-2 capsules (10-20 mg total) by mouth 4 (four) times daily -  before meals and at bedtime.  60 capsule  0  . Norgestimate-Ethinyl Estradiol Triphasic (ORTHO TRI-CYCLEN LO) 0.18/0.215/0.25 MG-25 MCG tab Take 1 tablet by mouth daily.  1 Package  0  . ondansetron (ZOFRAN) 4 MG tablet Take 1 tablet (4 mg total) by mouth every 8 (eight) hours as needed for nausea.  20 tablet  0  . promethazine (PHENERGAN) 25 MG tablet Take 1 tablet (25 mg total) by mouth every 8 (eight) hours as needed for nausea.  60 tablet  0   Current Facility-Administered Medications on File Prior  to Visit  Medication Dose Route Frequency Provider Last Rate Last Dose  . promethazine (PHENERGAN) injection 25 mg  25 mg Intramuscular Q6H PRN Chelle S Jeffery, PA-C   25 mg at 06/25/13 1131    Review of Systems:  As per HPI- otherwise negative. Complaining of cramping and pain over her entire body  Physical Examination: Filed Vitals:   08/21/13 1442  BP: 112/70  Pulse: 90  Temp: 98 F (36.7 C)  Resp: 16   Filed Vitals:   08/21/13 1442  Height: 5' 5.5" (1.664 m)  Weight: 112 lb 3.2 oz (50.894 kg)   Body mass index is 18.38 kg/(m^2). Ideal Body Weight: Weight in (lb) to have BMI = 25: 152.2  GEN: WDWN, NAD, Non-toxic, A & O x 3, tearful.  Appears to be having a hard time sitting up and communicating.   HEENT: Atraumatic, Normocephalic. Neck supple. No masses, No LAD. Ears and Nose: No external deformity. CV: RRR, No M/G/R. No JVD. No thrill. No extra heart  sounds. PULM: CTA B, no wheezes, crackles, rhonchi. No retractions. Breathing fast/ panting intermittently  ABD: S, ND, +BS. No rebound. No HSM.  Generalized tenderness over her abdomen EXTR: No c/c/e NEURO Normal gait.  PSYCH: Normally interactive. Conversant. Not depressed or anxious appearing.  Calm demeanor.  Aggressively putting her fingers down her throat to induce vomiting.  Asked her to please stop doing this but she did continue to induce vomiting.   Rectal: no gross blood on DTR.  Able to tolerate just a minimal exam due to pain Not able to cooperate with exam very well.  Curling up into a ball and complaining of diffuse pains in her body.     Results for orders placed in visit on 08/21/13  POCT CBC      Result Value Range   WBC 13.7 (*) 4.6 - 10.2 K/uL   Lymph, poc 1.4  0.6 - 3.4   POC LYMPH PERCENT 10.1  10 - 50 %L   MID (cbc) 0.7  0 - 0.9   POC MID % 4.8  0 - 12 %M   POC Granulocyte 11.7 (*) 2 - 6.9   Granulocyte percent 85.1 (*) 37 - 80 %G   RBC 4.93  4.04 - 5.48 M/uL   Hemoglobin 14.8  12.2 - 16.2 g/dL   HCT, POC 09.8  11.9 - 47.9 %   MCV 91.9  80 - 97 fL   MCH, POC 30.0  27 - 31.2 pg   MCHC 32.7  31.8 - 35.4 g/dL   RDW, POC 14.7     Platelet Count, POC 236  142 - 424 K/uL   MPV 8.9  0 - 99.8 fL   Started IV in right AC and gave 1 liter of fluids and 4mg  of IV zofran.  Started 2nd bag.  Pt did not improve Assessment and Plan: Nausea with vomiting - Plan: ondansetron (ZOFRAN) injection 4 mg, POCT CBC, CANCELED: Comprehensive metabolic panel  Diarrhea  Hailey [name] is here today with nausea, vomiting and diarrhea since this morning.  She seems to have a tendency towards severe GI illness. At this time she is acting very ill, is difficult to examine or speak with.  I do not think we will be able to get her well enough to discharge home here, especially without first checking her electrolytes which I cannot do at clinic.  Planned transfer via EMS.  Called ambulance for pt.   However pt then called and canceled her  ambulance because her husband plans to pick her up and take her to the hospital.  However then EMS did appear to get her, and pt and her mother decided to have her travel via EMS after all.     Signed Abbe Amsterdam, MD

## 2013-08-21 NOTE — ED Provider Notes (Signed)
CSN: [id number]     Arrival date & time 08/21/13  1713 History   First MD Initiated Contact with Patient 08/21/13 1714     Chief Complaint  Patient presents with  . Nausea  . Emesis   (Consider location/radiation/quality/duration/timing/severity/associated sxs/prior Treatment) The history is provided by the patient. No language interpreter was used.   Patient is [name] with past medical history of anxiety who comes to the emergency department today with vomiting and diarrhea since this morning. She reports approximately 50 episodes of vomiting and diarrhea. She describes blood in the diarrhea, but no clots.  She reports generalized abdominal pain. She denies dysuria, frequency, urgency, vaginal drainage, and vaginal bleeding.  He denies fevers or chills. She went to her primary care physician today was unable to perform history because of her discomfort. Visitor to the emergency department for further evaluation.  She's been noted in the past to self induce her vomiting the fingers into her throat. When EMS arrived at the clinic she was sticking her fingers down her throat stating that she was attempting to remove the vomit. She denies this currently.    Past Medical History  Diagnosis Date  . Anxiety    Past Surgical History  Procedure Laterality Date  . Hernia repair     History reviewed. No pertinent family history. History  Substance Use Topics  . Smoking status: Former Smoker    Types: Cigarettes    Quit date: 11/12/2008  . Smokeless tobacco: Never Used  . Alcohol Use: 1.0 oz/week    2 drink(s) per week   OB History   Grav Para Term Preterm Abortions TAB SAB Ect Mult Living                 Review of Systems  Constitutional: Negative for fever, chills and appetite change.  Respiratory: Negative for cough and shortness of breath.   Gastrointestinal: Positive for nausea, vomiting, abdominal pain, diarrhea and blood in stool. Negative for constipation.   Genitourinary: Negative for dysuria, urgency, frequency, decreased urine volume, vaginal bleeding, vaginal discharge and vaginal pain.  All other systems reviewed and are negative.    Allergies  Review of patient's allergies indicates no known allergies.  Home Medications   Current Outpatient Rx  Name  Route  Sig  Dispense  Refill  . ALPRAZolam (XANAX) 0.5 MG tablet   Oral   Take 1-2 tablets (0.5-1 mg total) by mouth at bedtime as needed for sleep.   30 tablet   0   . amphetamine-dextroamphetamine (ADDERALL) 15 MG tablet   Oral   Take 1 tablet (15 mg total) by mouth 2 (two) times daily.   60 tablet   0   . dicyclomine (BENTYL) 10 MG capsule   Oral   Take 1-2 capsules (10-20 mg total) by mouth 4 (four) times daily -  before meals and at bedtime.   60 capsule   0   . Norgestimate-Ethinyl Estradiol Triphasic (ORTHO TRI-CYCLEN LO) 0.18/0.215/0.25 MG-25 MCG tab   Oral   Take 1 tablet by mouth daily.   1 Package   0   . promethazine (PHENERGAN) 25 MG tablet   Oral   Take 1 tablet (25 mg total) by mouth every 8 (eight) hours as needed for nausea.   60 tablet   0   . ondansetron (ZOFRAN) 4 MG tablet   Oral   Take 1 tablet (4 mg total) by mouth every 8 (eight) hours as needed for nausea.  20 tablet   0    BP 144/86  Pulse 75  Temp(Src) 97.8 F (36.6 C) (Oral)  Resp 16  SpO2 100%  LMP 08/15/2013 Physical Exam  Nursing note and vitals reviewed. Constitutional: She is oriented to person, place, and time. She appears well-developed and well-nourished. No distress.  HENT:  Head: Normocephalic and atraumatic.  Eyes: Pupils are equal, round, and reactive to light.  Neck: Normal range of motion.  Cardiovascular: Normal rate, regular rhythm, normal heart sounds and intact distal pulses.   Pulmonary/Chest: Effort normal. No respiratory distress. She has no wheezes. She exhibits no tenderness.  Abdominal: Soft. Bowel sounds are normal. She exhibits no distension. There  is generalized tenderness. There is no rigidity, no rebound, no guarding, no tenderness at McBurney's point and negative Murphy's sign.  Neurological: She is alert and oriented to person, place, and time. She has normal strength. No cranial nerve deficit or sensory deficit. She exhibits normal muscle tone. Coordination and gait normal.  Poor effort with neuro exam.  Able to move all extremities symmetrically, but when asked to perform a task moves arms weekly and slowly.  Reports tingling in her entire body.    Skin: Skin is warm and dry.    ED Course  Procedures (including critical care time) Labs Review Labs Reviewed  CBC WITH DIFFERENTIAL - Abnormal; Notable for the following:    WBC 12.1 (*)    Neutrophils Relative % 92 (*)    Neutro Abs 11.1 (*)    Lymphocytes Relative 6 (*)    Monocytes Relative 2 (*)    All other components within normal limits  COMPREHENSIVE METABOLIC PANEL - Abnormal; Notable for the following:    Glucose, Bld 135 (*)    Creatinine, Ser 0.45 (*)    Calcium 8.1 (*)    Total Bilirubin 2.0 (*)    All other components within normal limits  URINALYSIS, ROUTINE W REFLEX MICROSCOPIC - Abnormal; Notable for the following:    APPearance CLOUDY (*)    Hgb urine dipstick LARGE (*)    Ketones, ur 40 (*)    Protein, ur 30 (*)    Leukocytes, UA MODERATE (*)    All other components within normal limits  URINE MICROSCOPIC-ADD ON - Abnormal; Notable for the following:    Squamous Epithelial / LPF FEW (*)    Bacteria, UA FEW (*)    All other components within normal limits  URINE CULTURE  LIPASE, BLOOD  POCT PREGNANCY, URINE   Imaging Review No results found.  EKG Interpretation   None       MDM  Patient is a 32 year old Caucasian female with past medical history of anxiety who comes emergency department today with abdominal pain, vomiting and diarrhea. Physical exam as above. With benign abdominal exam she was not felt to require abdominal imaging at this  time. Doubt appendicitis, acute cholecystitis, or pancreatitis. Initial workup included a UA, CBC, CMP, urine pregnancy, and a lipase. Lipase is 11. CMP was unremarkable. CBC and did receive 12.1 otherwise unremarkable. UA had a few epithelial cells few bacteria 11-20 WBCs. With no dysuria, frequency, or urgency doubt urinary tract infection. Urine pregnancy was negative. Doubt ectopic pregnancy. With generalized pain and no localization of the left lower or right lower quadrant doubt ovarian torsion. She was treated with a liter of normal saline and 4 mg of Zofran. Upon initial evaluation emergency department she requested fluids and ice chips. During her stay in the emergency department she used  ice chips frequently with no vomiting or diarrhea during her entire stay. She has a history of inducing vomiting is reported to have been found inducing vomiting with this incident as well. Upon reevaluation abdominal exam was unchanged. She was felt to be stable for discharge. She was instructed to followup with her primary care physician within the week. He was instructed to return to the emergency department she develops worsening pain, inability to tolerate by mouth, fevers, or any other concerns. She expressed understanding. She was discharged with prescription for Zofran. Labs were reviewed by myself and considered and medical decision-making. Care was discussed with my attending Dr. Wilkie Aye.    1. Vomiting   2. Diarrhea   3. Abdominal pain   4. Anxiety state, unspecified      Bethann Berkshire, MD 08/21/13 2050

## 2013-08-21 NOTE — ED Provider Notes (Signed)
I saw and evaluated the patient, reviewed the resident's note and I agree with the findings and plan.  EKG Interpretation   None      This is a 32 year old female who presents with nausea, vomiting, diarrhea, and bilateral lower Trinity paresthesias. She was seen in family medicine clinic earlier today and refer to the ER for further management. Patient has a history of anxiety and similar GI symptoms in the past. She has taken her Xanax today. She's very anxious appearing on exam. Abdominal exam is benign.  Review of the patient's chart and per EMS report, she has been seen sticking her fingers down her throat to "remove vomit."  Workup is reassuring. Patient will be discharged home.  Patient able to tolerate PO prior to d/c.  After history, exam, and medical workup I feel the patient has been appropriately medically screened and is safe for discharge home. Pertinent diagnoses were discussed with the patient. Patient was given return precautions.   Shon Baton, MD 08/22/13 [phone]

## 2013-08-21 NOTE — ED Notes (Signed)
Pt resting with eyes closed. RR is 22 and saturations are 100%. Significant other is at pt's side. Significant other states that the patient has had events like today, many years ago.

## 2013-08-22 NOTE — ED Provider Notes (Signed)
I saw and evaluated the patient, reviewed the resident's note and I agree with the findings and plan.  EKG Interpretation   None         Shon Baton, MD 08/22/13 [phone]

## 2013-08-23 LAB — URINE CULTURE

## 2013-08-24 NOTE — Progress Notes (Signed)
ED Antimicrobial Stewardship Positive Culture Follow Up   Hailey [name] is an 32 y.o. female who presented to [facility] on 08/21/2013 with a chief complaint of N/V/D  Chief Complaint  Patient presents with  . Nausea  . Emesis    Recent Results (from the past 720 hour(s))  URINE CULTURE     Status: None   Collection Time    08/21/13  6:00 PM      Result Value Range Status   Specimen Description URINE, CLEAN CATCH   Final   Special Requests NONE   Final   Culture  Setup Time     Final   Value: 08/22/2013 03:02     Performed at [facility] Count     Final   Value: >=100,000 COLONIES/ML     Performed at [facility]   Culture     Final   Value: ESCHERICHIA COLI     GROUP B STREP(S.AGALACTIAE)ISOLATED     Note: TESTING AGAINST S. AGALACTIAE NOT ROUTINELY PERFORMED DUE TO PREDICTABILITY OF AMP/PEN/VAN SUSCEPTIBILITY.     Performed at [facility]   Report Status 08/23/2013 FINAL   Final   Organism ID, Bacteria ESCHERICHIA COLI   Final    [x]  Patient discharged originally without antimicrobial agent and treatment is now indicated  44 YOF with N/V/D and found to have UTI -- discussed with ED-PA, to treat with short course of Bactrim.  New antibiotic prescription: Bactrim DS 1 tab bid x 3 days  ED Provider: Elpidio Anis, PA-C  [name] 08/24/2013, 1:14 PM Infectious Diseases Pharmacist Phone# [phone]

## 2013-08-25 ENCOUNTER — Ambulatory Visit (INDEPENDENT_AMBULATORY_CARE_PROVIDER_SITE_OTHER): Payer: BC Managed Care – PPO | Admitting: Family Medicine

## 2013-08-25 ENCOUNTER — Telehealth (HOSPITAL_COMMUNITY): Payer: Self-pay | Admitting: Emergency Medicine

## 2013-08-25 ENCOUNTER — Inpatient Hospital Stay (HOSPITAL_COMMUNITY)
Admission: AD | Admit: 2013-08-25 | Discharge: 2013-08-27 | DRG: 392 | Disposition: A | Discharge status: Home / Self Care | Payer: BC Managed Care – PPO | Source: Ambulatory Visit | Attending: Family Medicine | Admitting: Family Medicine

## 2013-08-25 VITALS — BP 152/91 | HR 91 | Temp 98.0°F | Resp 18 | Ht 66.0 in | Wt 108.0 lb

## 2013-08-25 DIAGNOSIS — N390 Urinary tract infection, site not specified: Secondary | ICD-10-CM

## 2013-08-25 DIAGNOSIS — R1084 Generalized abdominal pain: Secondary | ICD-10-CM

## 2013-08-25 DIAGNOSIS — E869 Volume depletion, unspecified: Secondary | ICD-10-CM

## 2013-08-25 DIAGNOSIS — F411 Generalized anxiety disorder: Secondary | ICD-10-CM | POA: Diagnosis present

## 2013-08-25 DIAGNOSIS — K5289 Other specified noninfective gastroenteritis and colitis: Secondary | ICD-10-CM

## 2013-08-25 DIAGNOSIS — A088 Other specified intestinal infections: Principal | ICD-10-CM | POA: Diagnosis present

## 2013-08-25 DIAGNOSIS — E8809 Other disorders of plasma-protein metabolism, not elsewhere classified: Secondary | ICD-10-CM | POA: Diagnosis present

## 2013-08-25 DIAGNOSIS — K529 Noninfective gastroenteritis and colitis, unspecified: Secondary | ICD-10-CM

## 2013-08-25 DIAGNOSIS — E876 Hypokalemia: Secondary | ICD-10-CM | POA: Diagnosis present

## 2013-08-25 DIAGNOSIS — R112 Nausea with vomiting, unspecified: Secondary | ICD-10-CM

## 2013-08-25 DIAGNOSIS — R17 Unspecified jaundice: Secondary | ICD-10-CM | POA: Diagnosis present

## 2013-08-25 DIAGNOSIS — A498 Other bacterial infections of unspecified site: Secondary | ICD-10-CM | POA: Diagnosis present

## 2013-08-25 DIAGNOSIS — F988 Other specified behavioral and emotional disorders with onset usually occurring in childhood and adolescence: Secondary | ICD-10-CM | POA: Diagnosis present

## 2013-08-25 DIAGNOSIS — F410 Panic disorder [episodic paroxysmal anxiety] without agoraphobia: Secondary | ICD-10-CM | POA: Diagnosis present

## 2013-08-25 DIAGNOSIS — R634 Abnormal weight loss: Secondary | ICD-10-CM

## 2013-08-25 DIAGNOSIS — Z87891 Personal history of nicotine dependence: Secondary | ICD-10-CM

## 2013-08-25 DIAGNOSIS — Z800 Family history of malignant neoplasm of digestive organs: Secondary | ICD-10-CM

## 2013-08-25 HISTORY — DX: Other specified behavioral and emotional disorders with onset usually occurring in childhood and adolescence: F98.8

## 2013-08-25 LAB — BASIC METABOLIC PANEL
BUN: 13 mg/dL (ref 6–23)
Calcium: 9.4 mg/dL (ref 8.4–10.5)
Chloride: 99 mEq/L (ref 96–112)
Creatinine, Ser: 0.66 mg/dL (ref 0.50–1.10)
GFR calc Af Amer: >90 mL/min (ref >90)
GFR calc non Af Amer: >90 mL/min (ref >90)

## 2013-08-25 LAB — POCT URINALYSIS DIPSTICK
Glucose, UA: NEGATIVE
Leukocytes, UA: NEGATIVE
Protein, UA: 100
Spec Grav, UA: 1.02
Urobilinogen, UA: 0.2

## 2013-08-25 LAB — POCT CBC
Granulocyte percent: 74.9 %G (ref 37–80)
HCT, POC: 47 % (ref 37.7–47.9)
Hemoglobin: 15.1 g/dL (ref 12.2–16.2)
Lymph, poc: 1.8 (ref 0.6–3.4)
MCH, POC: 29.7 pg (ref 27–31.2)
MCV: 92.3 fL (ref 80–97)
MID (cbc): 0.4 (ref 0–0.9)
MPV: 9.5 fL (ref 0–99.8)
POC LYMPH PERCENT: 20.4 %L (ref 10–50)
POC MID %: 4.7 %M (ref 0–12)
Platelet Count, POC: 225 10*3/uL (ref 142–424)
RBC: 5.09 M/uL (ref 4.04–5.48)
RDW, POC: 13.3 %

## 2013-08-25 LAB — POCT UA - MICROSCOPIC ONLY
Bacteria, U Microscopic: NEGATIVE
Casts, Ur, LPF, POC: NEGATIVE
Crystals, Ur, HPF, POC: NEGATIVE

## 2013-08-25 MED ORDER — ALPRAZOLAM 0.5 MG PO TABS
1.0000 mg | ORAL_TABLET | Freq: Every evening | ORAL | Status: DC | PRN
  Administered 2013-08-25: 1 mg via ORAL
  Administered 2013-08-27: 0.5 mg via ORAL
  Filled 2013-08-25 (×2): qty 2

## 2013-08-25 MED ORDER — POTASSIUM CHLORIDE 10 MEQ/100ML IV SOLN
10.0000 meq | INTRAVENOUS | Status: AC
  Administered 2013-08-25 – 2013-08-26 (×4): 10 meq via INTRAVENOUS
  Filled 2013-08-25 (×4): qty 100

## 2013-08-25 MED ORDER — ONDANSETRON HCL 4 MG/2ML IJ SOLN
4.0000 mg | Freq: Four times a day (QID) | INTRAMUSCULAR | Status: DC | PRN
  Administered 2013-08-26 (×3): 4 mg via INTRAVENOUS
  Filled 2013-08-25 (×3): qty 2

## 2013-08-25 MED ORDER — ONDANSETRON 4 MG PO TBDP
4.0000 mg | ORAL_TABLET | Freq: Once | ORAL | Status: AC
  Administered 2013-08-25: 4 mg via ORAL
  Filled 2013-08-25: qty 1

## 2013-08-25 MED ORDER — ONDANSETRON 4 MG PO TBDP
4.0000 mg | ORAL_TABLET | Freq: Once | ORAL | Status: AC
  Administered 2013-08-25: 4 mg via ORAL

## 2013-08-25 MED ORDER — SODIUM CHLORIDE 0.9 % IV SOLN
INTRAVENOUS | Status: DC
  Administered 2013-08-25 – 2013-08-27 (×3): via INTRAVENOUS

## 2013-08-25 MED ORDER — HEPARIN SODIUM (PORCINE) 5000 UNIT/ML IJ SOLN
5000.0000 [IU] | Freq: Three times a day (TID) | INTRAMUSCULAR | Status: DC
  Administered 2013-08-25 – 2013-08-27 (×5): 5000 [IU] via SUBCUTANEOUS
  Filled 2013-08-25 (×10): qty 1

## 2013-08-25 NOTE — Patient Instructions (Signed)
Go to [facility] and Register in the ER tell them that you have a room already.  Your room number is 6 [place]. Driving directions to The [facility] 3D2D  [phone]  - more info    [address]  [place], [place] [address]     1. Head south on [address] toward [address]      0.5 mi    2. Sharp left onto [address]      0.6 mi    3. Turn left onto the [employer] E ramp      0.2 mi    4. Merge onto [employer] [address] E      3.0 mi    5. Continue straight to stay on [employer] W E      0.4 mi    6. Slight left to stay on [employer] W E      1.2 mi    7. Turn right onto [employer]      0.1 mi    8. Turn left onto [employer]      361 ft    9. Take the 1st left onto [facility]  Destination will be on the right    Driving directions to [facility] 3D2D  [phone]  - more info    [address]  [place], [place] [address]     1. Head north on [address] toward [employer]     2. Turn right onto [employer] [address]      0.3 mi    3. Slight left to stay on [address]      1.7 mi    4. Turn left onto [employer]  Destination will be on the right     0.6 mi     [facility]  [address] [place]   Driving directions to [address] [place], [place], [place] [address] 3D2D  - more info    [address]  [place], [place] [address]     1. Head south on [address] toward [address]      0.5 mi     0.6 mi    3. Turn left onto the [employer] E ramp      0.2 mi    4. Merge onto [employer] [address] E      3.0 mi    5. Continue straight to stay on [employer] W E      0.4 mi    6. Slight left to stay on [facility]  Destination will be on the right     1.0 mi     Driving directions to [facility] 3D2D  [phone]  - more info    [address]  [place], [place] [address]     1. Head south on [address] toward [address]      0.5 mi    2. Sharp left onto [address]      0.6 mi    3. Turn left onto the [employer] E ramp      0.2 mi      4. Merge onto [employer] [address] E      3.0 mi    5. Slight right toward [employer] (signs for [address])      0.2 mi    6. Take the ramp to [facility]  338 ft    7. Turn left onto [employer] [address]      0.3 mi    8. Turn left onto [facility] [address]  Destination will be on the right     0.2 mi     [facility]  [address]   Driving directions to [facility] 3D2D  - more info    [address]  [place], [place] [address]     1. Head south on [address] toward [address]      0.7 mi    2. Turn left onto [employer]      0.4 mi    3. Take the 3rd right onto [facility] W W      1.1 mi    4. Take the [address] ramp to [facility]      0.2 mi    5. Merge onto [address]      3.7 mi    6. Take exit [address] for [address] toward [place]      0.3 mi    7. Keep left at the fork, follow signs for Airport      381 ft    8. Keep left at the fork, follow signs for [facility] S/[place]      302 ft    9. Turn left onto [address]      2.6 mi    10. Turn right onto [employer] will be on the left     0.2 mi

## 2013-08-25 NOTE — Progress Notes (Addendum)
Subjective:  This chart was scribed for Hailey Staggers, MD by Carl Best, Medical Scribe. This patient was seen in Room 4 and the patient's care was started at 4:20 PM.   Patient ID: Hailey [name], female    DOB: 11-Jun-1981, 32 y.o.   MRN: [id number]  HPI  HPI Comments: Hailey [name] is a 32 y.o. female who presents to the [facility] for a follow-up appointment.  She was seen on 08/21/13 by Dr. Dallas Schimke for nausea, emesis, and diarrhea.  See details regarding that office visit.  Noted at last office visit, bright red blood in her stool.  No apparent gross blood on digital rectal exam on last office visit but guarded exam.  Noted to have WBC of 13.7 with 85% granulocytes.  2L of IV fluids given without significant improvement.  Transferred to the hospital by EMS.  Per notes, history of induced emesis in office.  Discharged with prescription for Zofran.  Note reviewed from pharmacist dated 08/24/13.  Greater than 100,000 colonies E. Coli on urine culture from 08/21/2013.  PA from ER Elpidio Anis prescribed Bactrim BS 1 BID x 3 days - called about this rx this morning.   Patient is here today complaining of constant emesis and nausea that started four days ago.  She states that she will have about 20 emetic episodes a day and she will try and induce emesis by sticking her finger down her throat because she states that is the only way she will feel relief in her symptoms sometimes.  She states that every time she eats she feels as if her stomach is "convulsing" and has been unable to eat solid foods.  She states that she has had ice, chips, Sprite, and Gatorade which causes her to have emetic episodes that look like "white foam".  She states that she took a Zofran at 8:30 AM this morning and felt relief of her symptoms for about an hour.  She states that she took 1 Phenergan and 2 Zofran yesterday.  She states that she had her first bowel movement in four days today that was  [name] solid and [name] watery.  She states that her urine is dark, yellow in color.  She states that she has had to wear a pad because she feels tiny drops of urine come out on their own but denies experiencing urinary frequency or dysuria.  She denies any sick contacts.    She states that she has not started antibiotics for UTI but called by pharmacy this morning and advised they are ready for pickup.    Usual PCP : [name], PA-C   There are no active problems to display for this patient.  Past Medical History  Diagnosis Date  . Anxiety    Past Surgical History  Procedure Laterality Date  . Hernia repair     No Known Allergies Prior to Admission medications   Medication Sig Start Date End Date Taking? Authorizing Provider  ALPRAZolam Prudy Feeler) 0.5 MG tablet Take 1-2 tablets (0.5-1 mg total) by mouth at bedtime as needed for sleep. 06/25/13  Yes Chelle S Jeffery, PA-C  amphetamine-dextroamphetamine (ADDERALL) 15 MG tablet Take 1 tablet (15 mg total) by mouth 2 (two) times daily. 08/14/13 09/13/13 Yes Sarah Harvie Bridge, PA-C  dicyclomine (BENTYL) 10 MG capsule Take 1-2 capsules (10-20 mg total) by mouth 4 (four) times daily -  before meals and at bedtime. 06/25/13  Yes Chelle Tessa Lerner, PA-C  Norgestimate-Ethinyl Estradiol Triphasic (ORTHO TRI-CYCLEN LO)  0.18/0.215/0.25 MG-25 MCG tab Take 1 tablet by mouth daily. 07/05/13  Yes Morrell Riddle, PA-C  ondansetron (ZOFRAN) 4 MG tablet Take 1 tablet (4 mg total) by mouth every 8 (eight) hours as needed for nausea. 08/21/13  Yes Bethann Berkshire, MD  promethazine (PHENERGAN) 25 MG tablet Take 1 tablet (25 mg total) by mouth every 8 (eight) hours as needed for nausea. 06/25/13  Yes Chelle Tessa Lerner, PA-C   History   Social History  . Marital Status: Married    Spouse Name: Maurine Minister    Number of Children: N/A  . Years of Education: N/A   Occupational History  . Not on file.   Social History Main Topics  . Smoking status: Former Smoker    Types:  Cigarettes    Quit date: 11/12/2008  . Smokeless tobacco: Never Used  . Alcohol Use: 1.0 oz/week    2 drink(s) per week  . Drug Use: No  . Sexual Activity: Yes    Partners: Male    Birth Control/ Protection: Pill     Comment: pap done 12/29/11 by Dr Billy Coast this was negative   Other Topics Concern  . Not on file   Social History Narrative  . No narrative on file       Review of Systems  Constitutional: Positive for unexpected weight change. Negative for fever.  Gastrointestinal: Positive for nausea, vomiting and abdominal pain.  Genitourinary: Negative for dysuria, frequency, hematuria, vaginal bleeding, vaginal discharge, difficulty urinating and dyspareunia.  Musculoskeletal: Positive for back pain.  Neurological: Positive for weakness.  All other systems reviewed and are negative.     Objective:  Physical Exam  Nursing note and vitals reviewed. Constitutional: She is oriented to person, place, and time. She appears well-developed and well-nourished. No distress.  HENT:  Head: Normocephalic and atraumatic.  Mouth/Throat: Uvula is midline. Mucous membranes are dry. No oropharyngeal exudate, posterior oropharyngeal edema, posterior oropharyngeal erythema or tonsillar abscesses.  Slight tacky oral mucosa   Eyes: EOM are normal. Pupils are equal, round, and reactive to light.  Neck: Normal range of motion. Neck supple.  No lymphadenopathy   Cardiovascular: Normal rate, regular rhythm and normal heart sounds.  Exam reveals no gallop and no friction rub.   No murmur heard. Pulmonary/Chest: Effort normal and breath sounds normal. No respiratory distress.  Abdominal: Soft. Normal appearance. She exhibits no distension. Bowel sounds are increased. There is tenderness in the right upper quadrant, right lower quadrant, epigastric area and suprapubic area. There is no rigidity and no guarding.  Hyperactive bowel sounds, diffuse tenderness to palpation.  Musculoskeletal:  Slight  right sided CVA tenderness.  Neurological: She is alert and oriented to person, place, and time.  Skin: Skin is warm and dry. No rash noted.  Normal turgor  Psychiatric: She has a normal mood and affect. Her behavior is normal.    DIAGNOSTIC STUDIES: Oxygen Saturation is 99% on room air, normal by my interpretation.    COORDINATION OF CARE: 4:34 PM- Discussed obtaining a CBC and UA and taking stats with the patient.  The patient agreed to the treatment plan.     Filed Vitals:   08/25/13 1545  BP: 112/64  Pulse: 92  Temp: 98 F (36.7 C)  Resp: 18  Height: 5\' 6"  (1.676 m)  Weight: 108 lb (48.988 kg)  SpO2: 99%   Results for orders placed in visit on 08/25/13  POCT CBC      Result Value Range   WBC 8.7  4.6 - 10.2 K/uL   Lymph, poc 1.8  0.6 - 3.4   POC LYMPH PERCENT 20.4  10 - 50 %L   MID (cbc) 0.4  0 - 0.9   POC MID % 4.7  0 - 12 %M   POC Granulocyte 6.5  2 - 6.9   Granulocyte percent 74.9  37 - 80 %G   RBC 5.09  4.04 - 5.48 M/uL   Hemoglobin 15.1  12.2 - 16.2 g/dL   HCT, POC 16.1  09.6 - 47.9 %   MCV 92.3  80 - 97 fL   MCH, POC 29.7  27 - 31.2 pg   MCHC 32.1  31.8 - 35.4 g/dL   RDW, POC 04.5     Platelet Count, POC 225  142 - 424 K/uL   MPV 9.5  0 - 99.8 fL  POCT UA - MICROSCOPIC ONLY      Result Value Range   WBC, Ur, HPF, POC 0-1     RBC, urine, microscopic 3-5     Bacteria, U Microscopic neg     Mucus, UA postive     Epithelial cells, urine per micros 3-5     Crystals, Ur, HPF, POC neg     Casts, Ur, LPF, POC neg     Yeast, UA neg    POCT URINALYSIS DIPSTICK      Result Value Range   Color, UA yellow     Clarity, UA clear     Glucose, UA neg     Bilirubin, UA small     Ketones, UA neg     Spec Grav, UA 1.020     Blood, UA small     pH, UA 5.5     Protein, UA 100     Urobilinogen, UA 0.2     Nitrite, UA neg     Leukocytes, UA Negative       Assessment & Plan:  Jeidy Alexie is a 32 y.o. female Generalized abdominal pain - Plan: POCT CBC,  POCT UA - Microscopic Only, POCT urinalysis dipstick  Nausea and vomiting - Plan: POCT CBC, POCT UA - Microscopic Only, POCT urinalysis dipstick  Volume depletion - Plan: POCT CBC, POCT UA - Microscopic Only, POCT urinalysis dipstick  Loss of weight - Plan: POCT CBC, POCT UA - Microscopic Only, POCT urinalysis dipstick  UTI (urinary tract infection) - Plan: POCT CBC, POCT UA - Microscopic Only, POCT urinalysis dipstick  32 yo F with initial N/V and bloody diarrhea 5 days ago, with persistent nausea, anorexia, and wt loss with inability to tolerate po solids, and minimal fluids. Persistent n/v even with phenergan, then Zofran, and persistent diffuse abdominal pain.  Prior labs reviewed with reassuring CMP, lipids, negative HCG.  Urine culture noted with >100k E coli, but repeat U/A here appears better than last check. Possible underlying viral GE, vs food borne illness, vs component of self induced retching, but now 5 days of persistent symptoms and wt loss, failed ORT and antiemetics.  Zofran 4mg  given in office today.   Discussed Observation with possible admission with Dr. Gwenlyn Saran with [facility].  Direct admit obtained with information given to pt below. rtc after hospital if needed.   I personally performed the services described in this documentation, which was scribed in my presence. The recorded information has been reviewed and considered, and addended by me as needed.    Meds ordered this encounter  Medications  . ondansetron (ZOFRAN-ODT) disintegrating tablet 4 mg  Sig:    Patient Instructions   Go to [facility] and Register in the ER tell them that you have a room already.  Your room number is 6 [place]. Driving directions to The [facility] 3D2D  [phone]  - more info    [address]  [place], [place] [address]     1. Head south on [address] toward [address]      0.5 mi    2. Sharp left onto [address]      0.6 mi    3. Turn left onto the [employer] E ramp      0.2 mi    4. Merge onto [employer] E      3.0 mi    5. Continue straight to stay on [employer] W E      0.4 mi    6. Slight left to stay on [employer] W E      1.2 mi    7. Turn right onto [employer]      0.1 mi    8. Turn left onto [employer]      361 ft    9. Take the 1st left onto [facility]  Destination will be on the right    Driving directions to [facility] 3D2D  [phone]  - more info    [address]  [place], [place] [address]     1. Head north on [address] toward [employer]     2. Turn right onto [employer]      0.3 mi    3. Slight left to stay on [address]      1.7 mi    4. Turn left onto [employer]  Destination will be on the right     0.6 mi     [facility]  [address] [place]   Driving directions to [address] [place], [place], [place] [address] 3D2D  - more info    [address]  [place], [place] [address]     1. Head south on [address] toward [address]      0.5 mi     0.6 mi    3. Turn left onto the [employer] E ramp      0.2 mi    4. Merge onto [employer] E      3.0 mi    5. Continue straight to stay on [employer] W E      0.4 mi    6. Slight left to stay on [facility]  Destination will be on the right     1.0 mi     Driving directions to [facility] 3D2D  [phone]  - more info    [address]  [place], [place] [address]     1. Head south on [address] toward [address]      0.5 mi    2. Sharp left onto [address]      0.6 mi    3. Turn left onto the [employer] E ramp      0.2 mi    4. Merge onto [employer] E      3.0 mi    5. Slight right toward [employer] (signs for [address])      0.2 mi  6. Take the ramp to [employer]      338 ft    7. Turn left onto [employer]      0.3 mi    8. Turn left onto [facility] [address]  Destination will be on the right     0.2 mi     [facility]  [address]    Driving directions to [facility] 3D2D  - more info    [address]  [place], [place] [address]     1. Head south on [address] toward [address]      0.7 mi    2. Turn left onto [employer]      0.4 mi    3. Take the 3rd right onto [facility] W W      1.1 mi    4. Take the [address] ramp to [facility]      0.2 mi    5. Merge onto [address]      3.7 mi    6. Take exit [address] for [address] toward [place]      0.3 mi    7. Keep left at the fork, follow signs for Airport      381 ft    8. Keep left at the fork, follow signs for [facility] S/[place]      302 ft    9. Turn left onto [place]      2.6 mi    10. Turn right onto [employer] will be on the left     0.2 mi

## 2013-08-25 NOTE — H&P (Signed)
Family Medicine Teaching [facility] Admission History and Physical Service Pager: [phone]  Patient name: Hailey [name] Medical record number: [id number] Date of birth: 1980-11-28 Age: 32 y.o. Gender: female  Primary Care Provider: [facility] Consultants: none Code Status: Full  Chief Complaint: nausea and vomiting  Assessment and Plan: Darrell Leonhardt is a 32 y.o. female presenting with intractable nausea and vomiting . PMH is significant for anxiety.  # Intractable nausea and vomiting: likely initial component of viral gastro vs food poisoning, but given length of symptoms, it has now become more linked to anxiety. CMP normal on 10/29 except for some mildly elevated total blirubin - treat nausea and vomiting with IV zofran and IV fluids, in hope to break vomiting pattern - clear liquids as tolerated - if not better, consider abdominal US to rule out intra-abdominal etiology - discourage retching   # Anxiety: controled until recently. Had used xanax for it in the past. Likely playing a role in her nausea and vomiting.  - xanax as needed  # Hypokalemia: on BMP at 3.0. Unclear as to whether this could be causing the arm spasms and tightness she is describing, but will certainly replenish it - IV K Cl while patient is not able to tolerate oral meds  # Questionable UTI: culture from 08/21/13 with 100000 ecoli. Repeat ua unremarkable - will treat this with IV cetriaxone given admission for nausea and vomiting.   FEN/GI: clear liquid, NS at 125cc/hr Prophylaxis: heparin  Disposition: admit for observation to med surg bed.   History of Present Illness: Hailey [name] is a 32 y.o. female presenting with intractable nausea and vomiting. Started on Tuesday, 5 days ago when she ate a piece of [place] that may have been bad to eat. She developed bloody diarrhea over night, associated with nausea and vomiting. And inability to take anything by mouth. Diarrhea resolved  shortly after, but she continued having nausea,vomiting and anorexia. She was evaluated in the ED on 08/21/13 and was given IV fluids and IV nausea medications. She went home and had recurrence of symptoms. She describes some temporary relief with oral zofran at home. Otherwise, she reports throwing up after any solid foods. She has been able to keep water and sprite down. Emesis is foamy and white. She has tried to make her throw up in order to relieve her nausea. She has some muscle soreness from the wretching but no abdominal pain. She reports at least 12 episodes of emesis on the day of admit.  Of note, in the ED, she had UA and UCx which resulted in greater than 100,000 e-coli. She did not have a chance to pick Rx on the day of this admission. She denies any significant dysuria or urinary frequency. Repeat UA at [facility], after no treatment showed no leuks, no nitrites.   She reports increased anxiety associated with this. She had a significant h/o anxiety 9 years ago where she did have associated vomiting. She feels like this initial event has triggered stress and anxiety in addition to stress from life changes at home which include moving, changing schools.Marland KitchenMarland KitchenDuring panik attacks, she feels tightening of her arm muscles with muscle spasms, which resolves after a few seconds.   On follow up at [facility], it was thought that she would benefit from inpatient management of fluid resuscitation and nausea control.   Review Of Systems: Per HPI with the following additions: none Otherwise 12 point review of systems was performed and was unremarkable.  There are no active  problems to display for this patient.  Past Medical History: Past Medical History  Diagnosis Date  . Anxiety    Past Surgical History: Past Surgical History  Procedure Laterality Date  . Hernia repair     Social History: History  Substance Use Topics  . Smoking status: Former Smoker    Types: Cigarettes    Quit date: 11/12/2008   . Smokeless tobacco: Never Used  . Alcohol Use: 1.0 oz/week    2 drink(s) per week   Additional social history: none  Please also refer to relevant sections of EMR.  Family History: No family history on file. Allergies and Medications: No Known Allergies No current facility-administered medications on file prior to encounter.   Current Outpatient Prescriptions on File Prior to Encounter  Medication Sig Dispense Refill  . ALPRAZolam (XANAX) 0.5 MG tablet Take 1-2 tablets (0.5-1 mg total) by mouth at bedtime as needed for sleep.  30 tablet  0  . dicyclomine (BENTYL) 10 MG capsule Take 1-2 capsules (10-20 mg total) by mouth 4 (four) times daily -  before meals and at bedtime.  60 capsule  0  . Norgestimate-Ethinyl Estradiol Triphasic (ORTHO TRI-CYCLEN LO) 0.18/0.215/0.25 MG-25 MCG tab Take 1 tablet by mouth daily.  1 Package  0  . ondansetron (ZOFRAN) 4 MG tablet Take 1 tablet (4 mg total) by mouth every 8 (eight) hours as needed for nausea.  20 tablet  0  . promethazine (PHENERGAN) 25 MG tablet Take 1 tablet (25 mg total) by mouth every 8 (eight) hours as needed for nausea.  60 tablet  0    Objective: BP 128/88  Pulse 111  Temp(Src) 99.5 F (37.5 C) (Oral)  Resp 20  Ht 5\' 6"  (1.676 m)  Wt 110 lb 7.2 oz (50.1 kg)  BMI 17.84 kg/m2  SpO2 99%  LMP 08/15/2013 Exam: General: thin, anxious appearing female, in no acute distress HEENT: very dry mucous membranes, PERRLA, EOMI Cardiovascular: S1S2, RRR, 2/6 flow systolic murmur best heard at right sternal border Respiratory: cta bilaterally, no wheezing or crackles Abdomen: soft, tender periumbilically, no rebound, no guarding Extremities: no edema Skin: normal Neuro: no focal deficits, CN2-12 grossly intact  Labs and Imaging: CBC BMET   Recent Labs Lab 08/21/13 1729 08/25/13 1652  WBC 12.1* 8.7  HGB 13.2 15.1  HCT 37.0 47.0  PLT 180  --     Recent Labs Lab 08/21/13 1729  NA 139  K 4.1  CL 106  CO2 21  BUN 12   CREATININE 0.45*  GLUCOSE 135*  CALCIUM 8.1*     Lipase: 11 CMP     Component Value Date/Time   NA 134* 08/25/2013 2025   K 3.0* 08/25/2013 2025   CL 99 08/25/2013 2025   CO2 22 08/25/2013 2025   GLUCOSE 111* 08/25/2013 2025   BUN 13 08/25/2013 2025   CREATININE 0.66 08/25/2013 2025   CREATININE 0.63 06/25/2013 1054   CALCIUM 9.4 08/25/2013 2025   PROT 6.1 08/21/2013 1729   ALBUMIN 3.5 08/21/2013 1729   AST 17 08/21/2013 1729   ALT 19 08/21/2013 1729   ALKPHOS 55 08/21/2013 1729   BILITOT 2.0* 08/21/2013 1729   GFRNONAA >90 08/25/2013 2025   GFRAA >90 08/25/2013 2025      Lonia Skinner, MD 08/25/2013, 8:04 PM PGY-3, [place] [facility] FPTS Intern pager: [phone], text pages welcome

## 2013-08-25 NOTE — ED Notes (Signed)
Post ED Visit - Positive Culture Follow-up: Successful Patient Follow-Up  Culture assessed and recommendations reviewed by: []  [name], Pharm.D., BCPS []  [name], Pharm.D., BCPS [x]  [name], [address].D., BCPS []  [place], [place].D., BCPS, AAHIVP []  [name], Pharm.D., BCPS, AAHIVP  Positive urine culture  [x]  Patient discharged without antimicrobial prescription and treatment is now indicated []  Organism is resistant to prescribed ED discharge antimicrobial []  Patient with positive blood cultures  Changes discussed with ED provider: Elpidio Anis PA-C New antibiotic prescription: Bactrim DS 1 tab x 3 days    Hailey [name] 08/25/2013, 6:37 PM

## 2013-08-26 ENCOUNTER — Encounter (HOSPITAL_COMMUNITY): Payer: Self-pay | Admitting: Surgery

## 2013-08-26 LAB — CBC WITH DIFFERENTIAL/PLATELET
Basophils Absolute: 0 10*3/uL (ref 0.0–0.1)
Basophils Relative: 0 % (ref 0–1)
Eosinophils Absolute: 0 10*3/uL (ref 0.0–0.7)
Eosinophils Relative: 1 % (ref 0–5)
HCT: 35.2 % — ABNORMAL LOW (ref 36.0–46.0)
Hemoglobin: 12.7 g/dL (ref 12.0–15.0)
Lymphocytes Relative: 42 % (ref 12–46)
Lymphs Abs: 2.8 10*3/uL (ref 0.7–4.0)
MCH: 29.7 pg (ref 26.0–34.0)
MCHC: 36.1 g/dL — ABNORMAL HIGH (ref 30.0–36.0)
MCV: 82.4 fL (ref 78.0–100.0)
Monocytes Absolute: 0.7 10*3/uL (ref 0.1–1.0)
Monocytes Relative: 10 % (ref 3–12)
Neutro Abs: 3.2 10*3/uL (ref 1.7–7.7)
Neutrophils Relative %: 48 % (ref 43–77)
Platelets: 158 10*3/uL (ref 150–400)
RBC: 4.27 MIL/uL (ref 3.87–5.11)
RDW: 12.6 % (ref 11.5–15.5)
WBC: 6.7 10*3/uL (ref 4.0–10.5)

## 2013-08-26 LAB — COMPREHENSIVE METABOLIC PANEL
ALT: 26 U/L (ref 0–35)
AST: 14 U/L (ref 0–37)
Albumin: 3.1 g/dL — ABNORMAL LOW (ref 3.5–5.2)
Alkaline Phosphatase: 40 U/L (ref 39–117)
Calcium: 8.1 mg/dL — ABNORMAL LOW (ref 8.4–10.5)
GFR calc Af Amer: >90 mL/min (ref >90)
Potassium: 3.6 mEq/L (ref 3.5–5.1)
Sodium: 138 mEq/L (ref 135–145)
Total Protein: 5.7 g/dL — ABNORMAL LOW (ref 6.0–8.3)

## 2013-08-26 LAB — RAPID URINE DRUG SCREEN, HOSP PERFORMED
Barbiturates: NOT DETECTED
Benzodiazepines: POSITIVE — AB

## 2013-08-26 MED ORDER — ALPRAZOLAM 0.5 MG PO TABS
1.0000 mg | ORAL_TABLET | Freq: Once | ORAL | Status: AC
  Administered 2013-08-26: 1 mg via ORAL
  Filled 2013-08-26: qty 2

## 2013-08-26 MED ORDER — CEFTRIAXONE SODIUM 1 G IJ SOLR
1.0000 g | INTRAMUSCULAR | Status: DC
  Administered 2013-08-26 – 2013-08-27 (×2): 1 g via INTRAVENOUS
  Filled 2013-08-26 (×3): qty 10

## 2013-08-26 MED ORDER — CEPHALEXIN 500 MG PO CAPS: 500.0000 mg | ORAL_CAPSULE | Freq: Four times a day (QID) | ORAL | Status: DC

## 2013-08-26 NOTE — Progress Notes (Signed)
FMTS Attending Note Patient's care plan discussed with resident team; I agree with Dr Karle Plumber' assessment and plan as per this note.   Paula Compton, MD

## 2013-08-26 NOTE — Progress Notes (Signed)
Nutrition Brief Note  Patient identified on the Malnutrition Screening Tool (MST) Report  Wt Readings from Last 15 Encounters:  08/25/13 110 lb 7.2 oz (50.1 kg)  08/25/13 108 lb (48.988 kg)  08/21/13 112 lb 3.2 oz (50.894 kg)  11/23/12 113 lb 3.2 oz (51.347 kg)  07/06/12 117 lb (53.071 kg)  05/15/12 118 lb (53.524 kg)    Pt's usual wt is 117 lbs.  Pt with several days of nausea and vomiting leading to dehydration which resulting in wt loss. BMI WNL at baseline  Current diet order is clear liquids, patient is consuming approximately 50% of meals at this time. Labs and medications reviewed.   Pt states she typically has a good appetite and intake.  She reports she got food poisoning which triggered her anxiety and lead to several days of nausea,vomiting, and diarrhea.  Pt states she typically weighs 117 lbs and is usually stable at that number.  Pt reports she is tolerating clear liquids and looking forward to diet advancement.  She denies nutrition-related concerns. No nutrition interventions warranted at this time. If nutrition issues arise, please consult RD.   Loyce Dys, MS RD LDN Clinical Inpatient Dietitian Pager: [phone] Weekend/After hours pager: [phone]

## 2013-08-26 NOTE — Progress Notes (Signed)
Family Medicine Teaching Service Daily Progress Note Intern Pager: [phone]  Patient name: Hailey [name] Medical record number: [id number] Date of birth: Aug 10, 1981 Age: 32 y.o. Gender: female  Primary Care Provider: No PCP Per Patient Consultants: None Code Status: Full  Pt Overview and Major Events to Date:  11/2: Admitted with intractable nausea and vomiting  Assessment and Plan: Hailey [name] is a 32 y.o. female presenting with intractable nausea and vomiting . PMH is significant for anxiety.   # Intractable nausea and vomiting: Mixed etiology: Possible viral gastroenteritis given diarrhea at onset. Anxiety definitely contributing. Marijuana use likely contributing.  - IV anti-emetics, IVF - clear liquids, advance diet as tolerated  - Consider abdominal U/S if no improvement in 24 hours - UDS - Gastroccult (distinguish red emesis from blood vs. jello)  # Anxiety: controlled until recently. Had used xanax for it in the past. Likely playing a role in her nausea and vomiting.  - xanax as needed   # Hypokalemia: Resolved 3.0 > 3.6 s/p IV repletion - IV KCl while patient is not able to tolerate oral meds   # UTI: culture from 08/21/13 with >100k E. coli.  - Tx: CTX, will transition to keflex once tolerating po  # Hyperbilirubinemia: 2.0 > 1.7. Record of TBili is always mildly elevated (?gilbert's)  FEN/GI: clear liquid, NS at 125cc/hr  Prophylaxis: heparin  Disposition: Continued observation and diet advancement  Subjective: Pt continues to have emesis about 20 minutes after po intake despite anti-emetics. She reports history of nausea and anxiety relieved by smoking marijuana.   Objective: Temp:  [98.1 F (36.7 C)-99.5 F (37.5 C)] 98.5 F (36.9 C) (11/03 1353) Pulse Rate:  [78-111] 80 (11/03 1353) Resp:  [16-20] 16 (11/03 1353) BP: (98-152)/(55-91) 116/65 mmHg (11/03 1353) SpO2:  [99 %-100 %] 100 % (11/03 1353) Weight:  [110 lb 7.2 oz (50.1 kg)] 110 lb 7.2  oz (50.1 kg) (11/02 1942) Physical Exam: General: Thin, anxious-appearing 31yo female  Cardiovascular: RRR, no murmur noted, no JVD, no anterior chest wall crepitus Respiratory: Non-labored on room air, CTAB Abdomen: Soft, tender to palpation mostly periumbilically, no rebound, +BS Extremities: Warm, well-perfused, no edema.   Laboratory:  Recent Labs Lab 08/21/13 1729 08/25/13 1652 08/26/13 0444  WBC 12.1* 8.7 6.7  HGB 13.2 15.1 12.7  HCT 37.0 47.0 35.2*  PLT 180  --  158    Recent Labs Lab 08/21/13 1729 08/25/13 2025 08/26/13 0444  NA 139 134* 138  K 4.1 3.0* 3.6  CL 106 99 105  CO2 21 22 24   BUN 12 13 10   CREATININE 0.45* 0.66 0.62  CALCIUM 8.1* 9.4 8.1*  PROT 6.1  --  5.7*  BILITOT 2.0*  --  1.7*  ALKPHOS 55  --  40  ALT 19  --  26  AST 17  --  14  GLUCOSE 135* 111* 108*   Urine pregnancy test negative  Hazeline Junker, MD 08/26/2013, 4:41 PM PGY-1, [facility] Family Medicine FPTS Intern pager: [phone], text pages welcome

## 2013-08-26 NOTE — H&P (Signed)
FMTS Attending Admission Note: Hailey Levy MD [phone] pager office [phone] I  have seen and examined this patient, reviewed their chart. I have discussed this patient with the resident. I agree with the resident's findings, assessment and care plan.I think she started with a viral gastroenteritis and is having cyclic vomiting and nausea. She seems much improved on some antinausea medicine (zofran and benzodiazepines). i would likely d/c home this afternoon if she continues to have improved symptoms and can tolerate full liquids--she will need several days of anii-emetics bnd I would also continue benzodiazepine for 2-3 days (some of this is related to anxiety and that has contributed to cyclic issues). She seems to understand this.

## 2013-08-26 NOTE — Discharge Summary (Signed)
Family Medicine Teaching [facility] Discharge Summary  Patient name: Hailey [name] Medical record number: [id number] Date of birth: October 20, 1981 Age: 32 y.o. Gender: female Date of Admission: 08/25/2013  Date of Discharge: 08/27/2013 Admitting Physician: Nestor Ramp, MD  Primary Care Provider: No PCP Per Patient Consultants: Gastroenterology  Indication for Hospitalization: Nausea and vomiting  Discharge Diagnoses/Problem List:  Viral gastroenteritis Anxiety UTI Hypokalemia Marijuana use Possible cyclic vomiting syndrome  Disposition: Discharged home  Discharge Condition: Stable  Discharge Exam:  General: thin, anxious appearing female, in no acute distress  Cardiovascular: S1S2, RRR, no murmur appreciated Respiratory: Non-labored, CTAB Abdomen: soft, mildly tender periumbilically, no rebound, no guarding  Extremities: Warm, well perfused, no edema   Neuro: AOx3, CN II-XII intact  Brief Hospital Course:  Hailey [name] is a 32 y.o. female presenting with intractable nausea and vomiting. PMH is significant for anxiety.   Her intractable nausea and vomiting was thought to be likely due initially to viral gastroenteritis versus food poisoning, but exacerbated by anxiety it has continued. CMP normal on 10/29 except for some mildly elevated total blirubin, which is persistent on review of the medical record. Nausea and vomiting were treated with IV zofran and IV fluids, and also responded well to xanax. Her diet was advanced several times during the admission with repeated intolerance. A gastroenterology consultation was ordered to assess for dysmotility or other organic causes of this condition. Dr. Rhea Belton of [facility] happened to see her after she had dramatically improved and tolerated a regular diet. He recommended no additional inpatient work up or Materials engineer.   She was found on admission to be hypokalemic (3.0) which resolved after repletion to 3.6.  A  urinary tract infection found on 08/21/13 with >100k E. Coli was treated with rocephin IV followed by keflex. Its contribution to these symptoms is unclear.   Issues for Follow Up:  - GI recommended outpatient abdominal ultrasound outpatient to assess for possible biliary colic. Dr. Rhea Belton at [facility] saw the patient and can be reached at [phone]. - Incidental hyperbilirubinemia was noted which was clinically insignificant, though further work up is to the discretion of the PCP.  - Recommend a daily medication (e.g. SSRI, BuSpar) for anxiety if she continues to have related symptoms.   Significant Procedures: None  Significant Labs and Imaging:   Recent Labs Lab 08/25/13 1652 08/26/13 0444  WBC 8.7 6.7  HGB 15.1 12.7  HCT 47.0 35.2*  PLT  --  158    Recent Labs Lab 08/25/13 2025 08/26/13 0444  NA 134* 138  K 3.0* 3.6  CL 99 105  CO2 22 24  GLUCOSE 111* 108*  BUN 13 10  CREATININE 0.66 0.62  CALCIUM 9.4 8.1*  ALKPHOS  --  40  AST  --  14  ALT  --  26  ALBUMIN  --  3.1*   UDS: +THC UPreg: neg  Results/Tests Pending at Time of Discharge: None  Discharge Medications:    Medication List         ALPRAZolam 0.5 MG tablet  Commonly known as:  XANAX  Take 1-2 tablets (0.5-1 mg total) by mouth at bedtime as needed for sleep.     amphetamine-dextroamphetamine 15 MG tablet  Commonly known as:  ADDERALL  Take 15 mg by mouth 2 (two) times daily as needed (for adhd symptoms).     cephALEXin 500 MG capsule  Commonly known as:  KEFLEX  Take 1 capsule (500 mg total) by mouth 4 (four)  times daily.     dicyclomine 10 MG capsule  Commonly known as:  BENTYL  Take 1-2 capsules (10-20 mg total) by mouth 4 (four) times daily -  before meals and at bedtime.     Norgestimate-Ethinyl Estradiol Triphasic 0.18/0.215/0.25 MG-25 MCG tab  Commonly known as:  ORTHO TRI-CYCLEN LO  Take 1 tablet by mouth daily.     ondansetron 4 MG tablet  Commonly known as:  ZOFRAN  Take 1  tablet (4 mg total) by mouth every 8 (eight) hours as needed for nausea.     promethazine 25 MG tablet  Commonly known as:  PHENERGAN  Take 1 tablet (25 mg total) by mouth every 8 (eight) hours as needed for nausea.        Discharge Instructions: Please refer to Patient Instructions section of EMR for full details.  Patient was counseled important signs and symptoms that should prompt return to medical care, changes in medications, dietary instructions, activity restrictions, and follow up appointments.   Follow-Up Appointments: Follow-up Information   Follow up with [facility]    . Schedule an appointment as soon as possible for a visit in 1 week. (for hospital follow-up)    Contact information:   [address] [place] [address] [phone]      Hazeline Junker, MD 08/28/2013, 6:42 PM PGY-1, [facility] Family Medicine

## 2013-08-27 ENCOUNTER — Encounter (HOSPITAL_COMMUNITY): Payer: Self-pay | Admitting: Physician Assistant

## 2013-08-27 DIAGNOSIS — F411 Generalized anxiety disorder: Secondary | ICD-10-CM

## 2013-08-27 LAB — OCCULT BLOOD X 1 CARD TO LAB, STOOL: Fecal Occult Bld: NEGATIVE

## 2013-08-27 MED ORDER — PANTOPRAZOLE SODIUM 40 MG PO TBEC: 40.0000 mg | DELAYED_RELEASE_TABLET | Freq: Every day | ORAL | Status: DC

## 2013-08-27 NOTE — Consult Note (Signed)
Gastroenterology Consult: 2:39 PM 08/27/2013  LOS: 2 days    Referring Provider: Dr Mauricio Po of teaching service  Primary Care Physician:  [facility], [name] PA-C Primary Gastroenterologist:  unassigned     Reason for Consultation:  Nausea and vomiting.    HPI: Hailey [name] is a 32 y.o. female.  Takes Adderal though no listing of ADD as previous diagnoses.  Hx of anxiety and panic attacks requiring xanax which she use 2 or 3 times per month.  G2P1, C section for full term breach presentation 06/2010.   In early Sept 2014 had one day of n/v/diarrhea that resolved quickly.  She did not use the rxd anti-nauseals.  One week ago had N/Vdiarrhea recur up to 12 to 15 bouts of non-bloody emesis per 24 hours.  Stools initially watery but no blood or melena.  She thought it was food poisoning from [place].  Saw urgent care and sent to ED.  Given IVF and RX for zofran.  The n/v persisted and were somewhat better with taking hot showers and xanax gave better relief than Zofran as there was impending sense of panic accompanying the GI sxs.   The diarrhea got better but recurred over weekend. She was admitted 11/2 with hypokalemia.  Today is the first day she had been able to keep down solids in the last week.  Stool was still watery/loose this AM.   Advised on 11/1 that urine from 10/29 ED visit grew >100K of E coli but never picked up the phoned in RX before the admission.  She is day 2 of Rocephin.  No abdominal pain just soreness and tenderness from so much vomiting. Has never been treated with PPI or H2 blocker as outpt or inpt.   She recalls several instances of similar GI sxs 8 to 9 years ago when anxiety was more acute and she was having panic attacks.  She says when anxiety accelerates, as it has in last several weeks, she tends to have issues with stomach/gut. She smokes small amount of MJA ~ 2 x weekly and drinks one or 2 mixed drinks 2 to 3  x per week. All of this is stable.  Use of xanax about 2 x per month.     Past Medical History  Diagnosis Date  . Anxiety     Panic attacks ~ 2005  . ADD (attention deficit disorder)     Past Surgical History  Procedure Laterality Date  . Hernia repair    . Cesarean section  06/2010    g2 p1 as of 08/2013.   Marland Kitchen Therapeutic abortion      Prior to Admission medications   Medication Sig Start Date End Date Taking? Authorizing Provider  ALPRAZolam Prudy Feeler) 0.5 MG tablet Take 1-2 tablets (0.5-1 mg total) by mouth at bedtime as needed for sleep. 06/25/13  Yes Chelle S Jeffery, PA-C  amphetamine-dextroamphetamine (ADDERALL) 15 MG tablet Take 15 mg by mouth 2 (two) times daily as needed (for adhd symptoms).   Yes Historical Provider, MD  dicyclomine (BENTYL) 10 MG capsule Take 1-2 capsules (10-20 mg total) by mouth 4 (four) times daily -  before meals and at bedtime. 06/25/13  Yes Chelle S Jeffery, PA-C  Norgestimate-Ethinyl Estradiol Triphasic (ORTHO TRI-CYCLEN LO) 0.18/0.215/0.25 MG-25 MCG tab Take 1 tablet by mouth daily. 07/05/13  Yes Morrell Riddle, PA-C  ondansetron (ZOFRAN) 4 MG tablet Take 1 tablet (4 mg total) by mouth every 8 (eight) hours as needed for nausea. 08/21/13  Yes Bethann Berkshire, MD  promethazine (PHENERGAN) 25 MG tablet Take 1 tablet (25 mg total) by mouth every 8 (eight) hours as needed for nausea. 06/25/13  Yes Chelle S Jeffery, PA-C  cephALEXin (KEFLEX) 500 MG capsule Take 1 capsule (500 mg total) by mouth 4 (four) times daily. 08/26/13   Hazeline Junker, MD    Scheduled Meds: . cefTRIAXone (ROCEPHIN)  IV  1 g Intravenous Q24H  . heparin  5,000 Units Subcutaneous Q8H   Infusions: . sodium chloride 125 mL/hr at 08/27/13 0435   PRN Meds: ALPRAZolam, ondansetron (ZOFRAN) IV   Allergies as of 08/25/2013  . (No Known Allergies)    Family History Paternal GM had pancreatic cancer in her 44s Maternal greatGM had female cancer of some sort in late life.  Dad had cholecystectomy  in his 75s.   History   Social History  . Marital Status: Married    Spouse Name: Maurine Minister    Number of Children: N/A  . Years of Education: N/A   Occupational History  . 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 Smoking status: Former Smoker    Types: Cigarettes    Quit date: 11/12/2008  . Smokeless tobacco: Never Used  . Alcohol Use: 1.0 oz/week    2 drink(s) per week  . Drug Use: No  . Sexual Activity: Yes    Partners: Male    Birth Control/ Protection: Pill     Comment: pap done 12/29/11 by Dr Billy Coast this was negative   Other Topics Concern  . Not on file   Social History Narrative  . No narrative on file    REVIEW OF SYSTEMS: Constitutional:  Weight down from 115 to 108 ENT:  nonnose blleds, no sinus issues Pulm:  No cough, no dyspnea CV:  No chest pain.  Occasional tachycardia with stress.  GU:  No blood in urine Gyn.  MDs stopped her OCPs on admission, thinking the hormones might be contributing to n/v.  However she's been taking the same OCP for years. Marland Kitchen  GI:  Per HPI Heme:  No anemia.    Transfusions:  none Neuro/Psych:  Significant stress on home front has increased in last several weeks.  It overwhelms her, she acknowledges this Derm:  No rash or itching.  No tatoos.  + peircing.  Endocrine:  No elvated thirst or sweats.  No chills Immunization:  Not queried.  No flu shot this year  Travel:  To [place] in last year.    PHYSICAL EXAM: Vital signs in last 24 hours: Filed Vitals:   08/27/13 0647  BP: 115/58  Pulse: 80  Temp: 97.6 F (36.4 C)  Resp: 16   Wt Readings from Last 3 Encounters:  08/25/13 50.1 kg (110 lb 7.2 oz)  08/25/13 48.988 kg (108 lb)  08/21/13 50.894 kg (112 lb 3.2 oz)   General: thin, well-appearing wf .  Comfortable. Head:  No swelling  Eyes:  No icterus.  No pallor.  EOMI. Ears:  Not HOH  Nose:  No congestion Mouth:  Clear, moist.  Good teeth Neck:  No mass or JVD.  No TMG Lungs:   Clear.  Breathing unlabored.  No cough Heart: RRR.  No MRG Abdomen:  Soft, scaphoid, not tender.  No mass or HSM.Marland Kitchen   Rectal: deferred   Musc/Skeltl: no joint deformity or redness Extremities:  No CCE  Neurologic:  Pleasant, oriented x 3.  No limb weakness. No tremor  Skin:  No telangectasia, no rash Tattoos:  None.  + piercing on abdomen.  Nodes:  No cervical or inguinal adenopathy   Psych:  Moderately pressured but appropriate speech.  Not agitated.   Intake/Output from previous day: 11/03 0701 - 11/04 0700 In: 1145 [P.O.:120; I.V.:1025] Out: -  Intake/Output this shift: Total I/O In: 120 [P.O.:120] Out: -   LAB RESULTS:  Recent Labs  08/25/13 1652 08/26/13 0444  WBC 8.7 6.7  HGB 15.1 12.7  HCT 47.0 35.2*  PLT  --  158   BMET Lab Results  Component Value Date   NA 138 08/26/2013   NA 134* 08/25/2013   NA 139 08/21/2013   K 3.6 08/26/2013   K 3.0* 08/25/2013   K 4.1 08/21/2013   CL 105 08/26/2013   CL 99 08/25/2013   CL 106 08/21/2013   CO2 24 08/26/2013   CO2 22 08/25/2013   CO2 21 08/21/2013   GLUCOSE 108* 08/26/2013   GLUCOSE 111* 08/25/2013   GLUCOSE 135* 08/21/2013   BUN 10 08/26/2013   BUN 13 08/25/2013   BUN 12 08/21/2013   CREATININE 0.62 08/26/2013   CREATININE 0.66 08/25/2013   CREATININE 0.45* 08/21/2013   CALCIUM 8.1* 08/26/2013   CALCIUM 9.4 08/25/2013   CALCIUM 8.1* 08/21/2013   LFT  Recent Labs  08/26/13 0444  PROT 5.7*  ALBUMIN 3.1*  AST 14  ALT 26  ALKPHOS 40  BILITOT 1.7*   PT/INR No results found for this basename: INR,  PROTIME   Hepatitis Panel No results found for this basename: HEPBSAG, HCVAB, HEPAIGM, HEPBIGM,  in the last 72 hours C-Diff No components found with this basename: cdiff    Drugs of Abuse     Component Value Date/Time   LABOPIA NONE DETECTED 08/26/2013 2230   COCAINSCRNUR NONE DETECTED 08/26/2013 2230   LABBENZ POSITIVE* 08/26/2013 2230   AMPHETMU NONE DETECTED 08/26/2013 2230   THCU POSITIVE* 08/26/2013 2230    LABBARB NONE DETECTED 08/26/2013 2230     RADIOLOGY STUDIES: No abdominal imaging.   ENDOSCOPIC STUDIES: None ever  IMPRESSION:   *  Nausea, vomiting, diarrhea.  In last 18 hours has finally had improvement.  Significant anxiety overlay and chemistries c/w dehydration.  Need to rule out PUD, biliary sources though LFTs normal and she may well have gastritis  *  E coli UTI.  Day 2 Rocephin.  This may be a precipitant of GI  Sxs.   *  tox screen + for THC.  Stable pattern of smoking pot once daily once to twice weekly that long predates onset of the GI sxs so do not think THC is cause of the sxs.   *  Anxiety.  Hx panic disorder and ADD.  Stable use of Xanax a few times per month. Could benefit from referral to psychologist/psychiatrist in outpt setting in order to reassess the current meds.  She might do better on and anti-depressant.     PLAN:     *  Per Dr Rhea Belton.  *  For now will add once daily PPI   Jennye Moccasin  08/27/2013, 2:39 PM Pager: [phone]

## 2013-08-27 NOTE — Progress Notes (Signed)
Family Medicine Teaching Service Daily Progress Note Intern Pager: [phone]  Patient name: Hailey [name] Medical record number: [id number] Date of birth: Nov 24, 1980 Age: 32 y.o. Gender: female  Primary Care Provider: No PCP Per Patient Consultants: None Code Status: Full  Pt Overview and Major Events to Date:  11/2: Admitted with intractable nausea and vomiting  Assessment and Plan: Hailey [name] is a 32 y.o. female presenting with intractable nausea and vomiting . PMH is significant for anxiety.   # Intractable nausea and vomiting: Mixed etiology: Possible viral gastroenteritis given diarrhea at onset. Anxiety definitely contributing. Marijuana use likely contributing. No history of dysmotility.  - GI consult to evaluate organic cause of intractable vomiting - IV anti-emetics, IVF - Some tolerance of regular diet last night - UDS +THC - Gastroccult (distinguish red emesis from blood vs. jello)  # Anxiety: controlled until recently. Had used xanax for it in the past. Likely playing a role in her nausea and vomiting.  - xanax as needed   # Hypokalemia: Resolved 3.0 > 3.6 s/p IV repletion  # UTI: culture from 08/21/13 with >100k E. coli.  - Tx: CTX, will transition to keflex once tolerating po  # Hyperbilirubinemia: 2.0 > 1.7. Record of TBili is always mildly elevated (?gilbert's)  # Hypoalbuminemia Albumin 3.1 - Concern for subacute malnutrition. Poor oral intake recently in already thin female. No history of eating disorder.   FEN/GI: clear liquid, NS at 125cc/hr  Prophylaxis: heparin  Disposition: Continued observation and diet advancement, GI consultation today.   Subjective: Pt found sleeping still reporting nausea worse several hours following zofran or xanax dosing. She tolerated crackers and some mashed potatoes yesterday evening but had some emesis this AM.    Objective: Temp:  [97.6 F (36.4 C)-98.8 F (37.1 C)] 97.6 F (36.4 C) (11/04 0647) Pulse  Rate:  [80-89] 80 (11/04 0647) Resp:  [16] 16 (11/04 0647) BP: (114-116)/(58-69) 115/58 mmHg (11/04 0647) SpO2:  [97 %-100 %] 97 % (11/04 0647) Physical Exam: General: Thin, anxious-appearing 31yo female  Cardiovascular: RRR, no murmur noted, no JVD, no anterior chest wall crepitus Respiratory: Non-labored on room air, CTAB Abdomen: Soft, tender to palpation mostly periumbilically, no rebound, +BS Extremities: Warm, well-perfused, no edema.   Laboratory:  Recent Labs Lab 08/21/13 1729 08/25/13 1652 08/26/13 0444  WBC 12.1* 8.7 6.7  HGB 13.2 15.1 12.7  HCT 37.0 47.0 35.2*  PLT 180  --  158    Recent Labs Lab 08/21/13 1729 08/25/13 2025 08/26/13 0444  NA 139 134* 138  K 4.1 3.0* 3.6  CL 106 99 105  CO2 21 22 24   BUN 12 13 10   CREATININE 0.45* 0.66 0.62  CALCIUM 8.1* 9.4 8.1*  PROT 6.1  --  5.7*  BILITOT 2.0*  --  1.7*  ALKPHOS 55  --  40  ALT 19  --  26  AST 17  --  14  GLUCOSE 135* 111* 108*   Urine pregnancy test negative  Hazeline Junker, MD 08/27/2013, 12:44 PM PGY-1, [facility] Family Medicine FPTS Intern pager: [phone], text pages welcome

## 2013-08-27 NOTE — Consult Note (Signed)
Patient seen, examined, and I agree with the above documentation, including the assessment and plan. N/V diarrheal illness started at home. Etiology unclear, could have been infectious (viral vs. Toxin-mediated such as food poisoning).  She gives an excellent description of anxiety which started after the acute GI illness.  It seems the anxiety then became hard for her to manage.  This has happened before, including several hospitalizations for anxiety.  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  She reports her anxiety often centers around major life changes/stressors. She also has a UTI, which is being treated.  Difficult to know how this contributed to any GI symptoms. Now, N/V has completed resolved.  She tolerated a full, regular diet lunch.  She is craving chicken parm for dinner and asking when dinner will be delivered. At present, I do not think egd would be overly helpful given her dramatic improvement.  I do recommend an abd [place] to r/o biliary pathology such as stones which could cause intermittent symptoms/attacks. This could be done as an outpt. If symptoms persist, she can be seen in our GI office [phone]) Please call with questions.

## 2013-08-27 NOTE — Progress Notes (Signed)
Discharge instructions reviewed with patient. Questions answered re: follow-up appointments, outpatient tests, and new prescription.Patient verbalizes understanding. Patient discharged to home. Accompanied by husband.

## 2013-08-27 NOTE — Progress Notes (Signed)
FMTS Attending Note Patient seen and examined by me, discussed with resident team and I agree with assessment and plan per Dr Karle Plumber' note.  Paula Compton, MD

## 2013-08-28 SURGERY — EGD (ESOPHAGOGASTRODUODENOSCOPY)
Anesthesia: Moderate Sedation

## 2013-08-29 ENCOUNTER — Encounter (HOSPITAL_COMMUNITY): Payer: Self-pay | Admitting: Emergency Medicine

## 2013-08-29 ENCOUNTER — Emergency Department (HOSPITAL_COMMUNITY)
Admission: EM | Admit: 2013-08-29 | Discharge: 2013-08-30 | Disposition: A | Discharge status: Home / Self Care | Payer: BC Managed Care – PPO | Attending: Emergency Medicine | Admitting: Emergency Medicine

## 2013-08-29 ENCOUNTER — Emergency Department (HOSPITAL_COMMUNITY): Payer: BC Managed Care – PPO

## 2013-08-29 DIAGNOSIS — Z79899 Other long term (current) drug therapy: Secondary | ICD-10-CM | POA: Insufficient documentation

## 2013-08-29 DIAGNOSIS — F411 Generalized anxiety disorder: Secondary | ICD-10-CM | POA: Insufficient documentation

## 2013-08-29 DIAGNOSIS — Z87891 Personal history of nicotine dependence: Secondary | ICD-10-CM | POA: Insufficient documentation

## 2013-08-29 DIAGNOSIS — R112 Nausea with vomiting, unspecified: Secondary | ICD-10-CM | POA: Insufficient documentation

## 2013-08-29 DIAGNOSIS — R10816 Epigastric abdominal tenderness: Secondary | ICD-10-CM | POA: Insufficient documentation

## 2013-08-29 DIAGNOSIS — F988 Other specified behavioral and emotional disorders with onset usually occurring in childhood and adolescence: Secondary | ICD-10-CM | POA: Insufficient documentation

## 2013-08-29 DIAGNOSIS — Z3202 Encounter for pregnancy test, result negative: Secondary | ICD-10-CM | POA: Insufficient documentation

## 2013-08-29 DIAGNOSIS — R197 Diarrhea, unspecified: Secondary | ICD-10-CM | POA: Insufficient documentation

## 2013-08-29 LAB — CBC WITH DIFFERENTIAL/PLATELET
Basophils Relative: 0 % (ref 0–1)
Eosinophils Absolute: 0.1 10*3/uL (ref 0.0–0.7)
Hemoglobin: 16 g/dL — ABNORMAL HIGH (ref 12.0–15.0)
MCHC: 36.2 g/dL — ABNORMAL HIGH (ref 30.0–36.0)
MCV: 83.1 fL (ref 78.0–100.0)
Monocytes Relative: 5 % (ref 3–12)
Neutro Abs: 7.7 10*3/uL (ref 1.7–7.7)
Neutrophils Relative %: 82 % — ABNORMAL HIGH (ref 43–77)
RBC: 5.32 MIL/uL — ABNORMAL HIGH (ref 3.87–5.11)

## 2013-08-29 LAB — URINALYSIS, ROUTINE W REFLEX MICROSCOPIC
Bilirubin Urine: NEGATIVE
Hgb urine dipstick: NEGATIVE
Ketones, ur: 15 mg/dL — AB
Nitrite: NEGATIVE
Protein, ur: NEGATIVE mg/dL
Specific Gravity, Urine: 1.019 (ref 1.005–1.030)
Urobilinogen, UA: 0.2 mg/dL (ref 0.0–1.0)
pH: 7.5 (ref 5.0–8.0)

## 2013-08-29 LAB — COMPREHENSIVE METABOLIC PANEL
ALT: 47 U/L — ABNORMAL HIGH (ref 0–35)
AST: 31 U/L (ref 0–37)
Albumin: 4.3 g/dL (ref 3.5–5.2)
Alkaline Phosphatase: 58 U/L (ref 39–117)
BUN: 14 mg/dL (ref 6–23)
Calcium: 10 mg/dL (ref 8.4–10.5)
Chloride: 100 mEq/L (ref 96–112)
Potassium: 4.4 mEq/L (ref 3.5–5.1)
Total Bilirubin: 1.3 mg/dL — ABNORMAL HIGH (ref 0.3–1.2)

## 2013-08-29 LAB — LIPASE, BLOOD: Lipase: 17 U/L (ref 11–59)

## 2013-08-29 LAB — POCT PREGNANCY, URINE: Preg Test, Ur: NEGATIVE

## 2013-08-29 MED ORDER — SODIUM CHLORIDE 0.9 % IV BOLUS (SEPSIS)
1000.0000 mL | Freq: Once | INTRAVENOUS | Status: AC
  Administered 2013-08-29: 1000 mL via INTRAVENOUS

## 2013-08-29 MED ORDER — IOHEXOL 300 MG/ML  SOLN
25.0000 mL | INTRAMUSCULAR | Status: AC
  Administered 2013-08-29: 25 mL via ORAL

## 2013-08-29 MED ORDER — PROMETHAZINE HCL 25 MG/ML IJ SOLN
25.0000 mg | Freq: Once | INTRAMUSCULAR | Status: AC
  Administered 2013-08-29: 25 mg via INTRAVENOUS
  Filled 2013-08-29: qty 1

## 2013-08-29 NOTE — ED Notes (Signed)
Spoke with Dr. Wilkie Aye regarding patient

## 2013-08-29 NOTE — ED Provider Notes (Signed)
CSN: [id number]     Arrival date & time 08/29/13  1615 History   First MD Initiated Contact with Patient 08/29/13 1739     Chief Complaint  Patient presents with  . Emesis   (Consider location/radiation/quality/duration/timing/severity/associated sxs/prior Treatment) HPI  This is a 32 year old female who presents with vomiting.  Patient was seen in the ER last week for the same. She subsequently was admitted by her primary care physician. She was evaluated by GI while in the hospital who recommended outpatient ultrasound. She's not had that. Since discharge on Tuesday night, the patient reports improvement of symptoms yesterday. She was able to tolerate some by mouth. However, this morning she developed nonbilious, nonbloody emesis which was refractory to Zofran. She states that her abdomen "aches" but she thinks this is from vomiting. Patient also states that she has had recurrence of her diarrhea which she had one week ago but that had gotten better. She denies any fevers.  Past Medical History  Diagnosis Date  . Anxiety     Panic attacks ~ 2005  . ADD (attention deficit disorder)    Past Surgical History  Procedure Laterality Date  . Hernia repair    . Cesarean section  06/2010    g2 p1 as of 08/2013.   Marland Kitchen Therapeutic abortion     History reviewed. No pertinent family history. History  Substance Use Topics  . Smoking status: Former Smoker    Types: Cigarettes    Quit date: 11/12/2008  . Smokeless tobacco: Never Used  . Alcohol Use: 1.0 oz/week    2 drink(s) per week   OB History   Grav Para Term Preterm Abortions TAB SAB Ect Mult Living                 Review of Systems  Respiratory: Negative for chest tightness and shortness of breath.   Cardiovascular: Negative for chest pain.  Gastrointestinal: Positive for nausea, vomiting and diarrhea. Negative for abdominal pain, constipation and blood in stool.  Genitourinary: Negative for dysuria.  Musculoskeletal: Negative for  back pain.  Neurological: Negative for headaches.  Psychiatric/Behavioral: The patient is not nervous/anxious.   All other systems reviewed and are negative.    Allergies  Review of patient's allergies indicates no known allergies.  Home Medications   Current Outpatient Rx  Name  Route  Sig  Dispense  Refill  . ALPRAZolam (XANAX) 0.5 MG tablet   Oral   Take 0.5-1 mg by mouth 2 (two) times daily as needed for anxiety.         Marland Kitchen amphetamine-dextroamphetamine (ADDERALL) 15 MG tablet   Oral   Take 15 mg by mouth 2 (two) times daily as needed (for adhd symptoms).         . ondansetron (ZOFRAN) 4 MG tablet   Oral   Take 1 tablet (4 mg total) by mouth every 8 (eight) hours as needed for nausea.   20 tablet   0   . ondansetron (ZOFRAN ODT) 4 MG disintegrating tablet   Oral   Take 1 tablet (4 mg total) by mouth every 8 (eight) hours as needed for nausea.   10 tablet   0   . promethazine (PHENERGAN) 25 MG tablet   Oral   Take 1 tablet (25 mg total) by mouth every 6 (six) hours as needed for nausea or vomiting.   12 tablet   0    BP 124/61  Pulse 84  Temp(Src) 99 F (37.2 C) (Oral)  Resp  18  SpO2 97%  LMP 08/15/2013 Physical Exam  Nursing note and vitals reviewed. Constitutional: She is oriented to person, place, and time. She appears well-developed and well-nourished. No distress.  HENT:  Head: Normocephalic and atraumatic.  Mucous membranes dry  Eyes: Pupils are equal, round, and reactive to light.  Neck: Neck supple.  Cardiovascular: Normal rate, regular rhythm and normal heart sounds.   No murmur heard. Pulmonary/Chest: Effort normal and breath sounds normal. No respiratory distress.  Abdominal: Soft. Bowel sounds are normal.  Tenderness to palpation over the epigastrium without rebound or guarding, negative Murphy's sign  Musculoskeletal: She exhibits no edema.  Neurological: She is alert and oriented to person, place, and time.  Skin: Skin is warm and  dry.  Psychiatric: She has a normal mood and affect.    ED Course  Procedures (including critical care time) Labs Review Labs Reviewed  CBC WITH DIFFERENTIAL - Abnormal; Notable for the following:    RBC 5.32 (*)    Hemoglobin 16.0 (*)    MCHC 36.2 (*)    Neutrophils Relative % 82 (*)    All other components within normal limits  COMPREHENSIVE METABOLIC PANEL - Abnormal; Notable for the following:    Glucose, Bld 188 (*)    ALT 47 (*)    Total Bilirubin 1.3 (*)    All other components within normal limits  URINALYSIS, ROUTINE W REFLEX MICROSCOPIC - Abnormal; Notable for the following:    Ketones, ur 15 (*)    All other components within normal limits  LACTIC ACID, PLASMA - Abnormal; Notable for the following:    Lactic Acid, Venous 3.4 (*)    All other components within normal limits  LIPASE, BLOOD  LACTIC ACID, PLASMA  POCT PREGNANCY, URINE   Imaging Review Ct Abdomen Pelvis W Contrast  08/30/2013   CLINICAL DATA:  Recurrent vomiting. Currently being treated for urinary tract infection.  EXAM: CT ABDOMEN AND PELVIS WITH CONTRAST  TECHNIQUE: Multidetector CT imaging of the abdomen and pelvis was performed using the standard protocol following bolus administration of intravenous contrast.  CONTRAST:  80mL OMNIPAQUE IOHEXOL 300 MG/ML  SOLN  COMPARISON:  No priors.  FINDINGS: Lung Bases: Unremarkable.  Abdomen/Pelvis: The appearance of the liver, gallbladder, pancreas, spleen, bilateral adrenal glands and the right kidney is unremarkable. Sub cm low-attenuation lesion in the interpolar region of the left kidney is too small to definitively characterize. 4 mm nonobstructive calculus in the interpolar collecting system of the left kidney.  No significant volume of ascites. No pneumoperitoneum. No pathologic distention of small bowel. No definite lymphadenopathy identified within the abdomen or pelvis. Normal appendix. Uterus and ovaries are unremarkable in appearance. Urinary bladder is  normal in appearance.  Musculoskeletal: There are no aggressive appearing lytic or blastic lesions noted in the visualized portions of the skeleton.  IMPRESSION: 1. No acute findings in the abdomen or pelvis to account for the patient's symptoms. Specifically, the appendix is normal. 2. 4 mm nonobstructive calculus in the interpolar collecting system of the left kidney incidentally noted.   Electronically Signed   By: Trudie Reed M.D.   On: 08/30/2013 02:36   US Abdomen Limited  08/29/2013   CLINICAL DATA:  31-year- female with abdominal pain. History of hernia repair.  EXAM: LIMITED ABDOMEN ULTRASOUND FOR ASCITES  COMPARISON:  None.  FINDINGS: Gallbladder  No gallstones or wall thickening visualized. No sonographic Murphy sign noted.  Common bile duct  Diameter: 2 mm, normal.  Liver  No focal  lesion identified. Within normal limits in parenchymal echogenicity.  IVC  No abnormality visualized.  Pancreas  Visualized portion unremarkable.  Spleen  Size and appearance within normal limits.  Right Kidney  Length: 11.8 cm. Echogenicity within normal limits. No mass or hydronephrosis visualized.  Left Kidney  Length: 11.2 cm. There is a 6 mm non shadowing echogenic focus at the hilum suggestive of nonobstructing nephrolithiasis. Echogenicity within normal limits. No mass or hydronephrosis visualized.  Abdominal aorta  No aneurysm visualized.  IMPRESSION: 1. Left nonobstructing nephrolithiasis suspected.  2. Negative gallbladder and otherwise negative abdominal ultrasound.   Electronically Signed   By: Augusto Gamble M.D.   On: 08/29/2013 19:05    EKG Interpretation   None      Medications  iohexol (OMNIPAQUE) 300 MG/ML solution 25 mL (25 mLs Oral Contrast Given 08/29/13 2339)  sodium chloride 0.9 % bolus 1,000 mL (0 mLs Intravenous Stopped 08/29/13 2100)  promethazine (PHENERGAN) injection 25 mg (25 mg Intravenous Given 08/29/13 1913)  sodium chloride 0.9 % bolus 1,000 mL (0 mLs Intravenous Stopped 08/29/13  2242)  iohexol (OMNIPAQUE) 300 MG/ML solution 80 mL (80 mLs Intravenous Contrast Given 08/30/13 0218)    MDM   1. Nausea and vomiting in adult    Patient presents with persistent nausea and vomiting. She's also redevelop diarrhea. She is nontoxic-appearing on exam but does appear dry. Patient received 2 L of normal saline. Lab work is only notable for ketones in the urine and a lactate of 3.4. The patient was given pain medicine and antiemetic.  Ultrasound is negative for acute process. Patient continues to endorse mild abdominal pain with nausea and vomiting. CT scan was obtained. CT was pending at time of my sign out. Disposition per CT scan. Patient is able to tolerate by mouth prior to discharge, anticipate she can be discharged if CT scan is negative.    Shon Baton, MD 08/31/13 1105

## 2013-08-29 NOTE — ED Notes (Addendum)
Pt in c/o vomiting since last Tuesday, states she was seen here for same and admitted, pt has been taking zofran at home for the vomiting without relief, states she was supposed to have a follow up for eval of her gallbladder but they haven't scheduled it, states she is being treated for a UTI as well, pt actively vomiting in triage, last had zofran PTA

## 2013-08-29 NOTE — ED Notes (Signed)
Patient to us at this time.

## 2013-08-29 NOTE — ED Notes (Signed)
Previous RN was in the process of adm Phenergan for her nausea that was not controlled by the Zofran

## 2013-08-30 ENCOUNTER — Emergency Department (HOSPITAL_COMMUNITY): Payer: BC Managed Care – PPO

## 2013-08-30 ENCOUNTER — Encounter (HOSPITAL_COMMUNITY): Payer: Self-pay | Admitting: Radiology

## 2013-08-30 LAB — LACTIC ACID, PLASMA: Lactic Acid, Venous: 1.2 mmol/L (ref 0.5–2.2)

## 2013-08-30 MED ORDER — PROMETHAZINE HCL 25 MG PO TABS: 25.0000 mg | ORAL_TABLET | Freq: Four times a day (QID) | ORAL | Status: DC | PRN

## 2013-08-30 MED ORDER — IOHEXOL 300 MG/ML  SOLN
80.0000 mL | Freq: Once | INTRAMUSCULAR | Status: AC | PRN
  Administered 2013-08-30: 80 mL via INTRAVENOUS

## 2013-08-30 MED ORDER — ONDANSETRON 4 MG PO TBDP: 4.0000 mg | ORAL_TABLET | Freq: Three times a day (TID) | ORAL | Status: DC | PRN

## 2013-08-30 NOTE — ED Provider Notes (Signed)
Pt with n/v - improved during stay - labs unremarkable - CT and [place] wtihout acute findings - pt informed, and stable for d/c.  Meds given in ED:  Medications  iohexol (OMNIPAQUE) 300 MG/ML solution 25 mL (25 mLs Oral Contrast Given 08/29/13 2339)  sodium chloride 0.9 % bolus 1,000 mL (0 mLs Intravenous Stopped 08/29/13 2100)  promethazine (PHENERGAN) injection 25 mg (25 mg Intravenous Given 08/29/13 1913)  sodium chloride 0.9 % bolus 1,000 mL (0 mLs Intravenous Stopped 08/29/13 2242)  iohexol (OMNIPAQUE) 300 MG/ML solution 80 mL (80 mLs Intravenous Contrast Given 08/30/13 0218)    New Prescriptions   ONDANSETRON (ZOFRAN ODT) 4 MG DISINTEGRATING TABLET    Take 1 tablet (4 mg total) by mouth every 8 (eight) hours as needed for nausea.   PROMETHAZINE (PHENERGAN) 25 MG TABLET    Take 1 tablet (25 mg total) by mouth every 6 (six) hours as needed for nausea or vomiting.      Vida Roller, MD 08/30/13 [phone]

## 2013-08-30 NOTE — ED Notes (Signed)
The pt is sleeping she has hardly drank any oral contrast.  Pt awakened to ask her to drink her oral contrast

## 2013-09-04 ENCOUNTER — Encounter: Payer: Self-pay | Admitting: Physician Assistant

## 2013-09-04 ENCOUNTER — Ambulatory Visit (INDEPENDENT_AMBULATORY_CARE_PROVIDER_SITE_OTHER): Payer: BC Managed Care – PPO | Admitting: Physician Assistant

## 2013-09-04 VITALS — BP 128/74 | HR 122 | Temp 98.1°F | Resp 18 | Ht 65.75 in | Wt 108.4 lb

## 2013-09-04 DIAGNOSIS — Z87442 Personal history of urinary calculi: Secondary | ICD-10-CM

## 2013-09-04 DIAGNOSIS — F988 Other specified behavioral and emotional disorders with onset usually occurring in childhood and adolescence: Secondary | ICD-10-CM

## 2013-09-04 DIAGNOSIS — F411 Generalized anxiety disorder: Secondary | ICD-10-CM

## 2013-09-04 LAB — POCT URINALYSIS DIPSTICK
Glucose, UA: NEGATIVE
Nitrite, UA: NEGATIVE
Urobilinogen, UA: 0.2

## 2013-09-04 MED ORDER — AMPHETAMINE-DEXTROAMPHETAMINE 15 MG PO TABS: 15.0000 mg | ORAL_TABLET | Freq: Two times a day (BID) | ORAL | Status: DC | PRN

## 2013-09-04 MED ORDER — ALPRAZOLAM 0.5 MG PO TABS: 0.5000 mg | ORAL_TABLET | Freq: Two times a day (BID) | ORAL | Status: DC | PRN

## 2013-09-04 NOTE — Progress Notes (Signed)
    Subjective:    Patient ID: Jana Half, female    DOB: 18-May-1981, 32 y.o.   MRN: [id number]  HPI  Pt presents to clinic for med refill.  She is still getting results from her Adderall.  She does not take it everyday.  She is working part-time in [employer] and really loves it - it allows her time to herself from her daughter.  She recently got out of the hospital from colitis.  She has had some increase in her anxiety since her release.  She and her husband are having a difficult time - they were planning on moving so he could get a promotion but since her hospitalization he is worried that is she would get sick again they would have no family around to help and he would have to take off work which is not possible in his job.  They would like to buy a house and get out of their townhome - she would like to have another child but her husband is not sure he is ready.  She is having some increased anxiety but still not using the max dose of her Xanax.  Review of Systems     Objective:   Physical Exam  Vitals reviewed. Constitutional: She is oriented to person, place, and time. She appears well-developed and well-nourished.  HENT:  Head: Normocephalic and atraumatic.  Right Ear: External ear normal.  Left Ear: External ear normal.  Eyes: Conjunctivae are normal.  Neck: Normal range of motion.  Pulmonary/Chest: Effort normal.  Neurological: She is alert and oriented to person, place, and time.  Skin: Skin is warm and dry.  Psychiatric: She has a normal mood and affect. Her behavior is normal. Judgment and thought content normal.     Results for orders placed in visit on 09/04/13  POCT URINALYSIS DIPSTICK      Result Value Range   Color, UA yellow     Clarity, UA clear     Glucose, UA neg     Bilirubin, UA neg     Ketones, UA trace     Spec Grav, UA >=1.030     Blood, UA trace     pH, UA 5.5     Protein, UA 100     Urobilinogen, UA 0.2     Nitrite, UA neg     Leukocytes,  UA Negative          Assessment & Plan:  ADD (attention deficit disorder) - Plan: amphetamine-dextroamphetamine (ADDERALL) 15 MG tablet  History of kidney stones - - seen on CT while at the hospital and now patient is worried about them.  Her urine is concentrated - she should increase her fluid intake.  Plan: POCT urinalysis dipstick  Generalized anxiety disorder - D/w pt that she should be aware of her anxiety because if she starts to have increased daily anxiety she may ned to restart her daily medication.  Plan: ALPRAZolam (XANAX) 0.5 MG tablet  Ok to refill for 6 months.  [name] PA-C 09/04/2013 2:10 PM

## 2013-09-09 ENCOUNTER — Telehealth: Payer: Self-pay

## 2013-09-09 NOTE — Telephone Encounter (Signed)
PT IS REQUESTING RX REFILL FOR BIRTH CONTROL THROUGH [facility]. PT IS OUT OF RX WITH NO REFILLS

## 2013-09-10 MED ORDER — NORGESTIM-ETH ESTRAD TRIPHASIC 0.18/0.215/0.25 MG-25 MCG PO TABS: 1.0000 | ORAL_TABLET | Freq: Every day | ORAL | Status: DC

## 2013-09-10 NOTE — Telephone Encounter (Signed)
I sent in a 3 month supply.  I am not sure who does her Pap smears but I need to know when her last pap smear was.

## 2013-09-10 NOTE — Telephone Encounter (Signed)
She had her Pap last year.

## 2013-11-02 ENCOUNTER — Telehealth: Payer: Self-pay

## 2013-11-02 DIAGNOSIS — F988 Other specified behavioral and emotional disorders with onset usually occurring in childhood and adolescence: Secondary | ICD-10-CM

## 2013-11-02 NOTE — Telephone Encounter (Signed)
Pt would like a refill on adderall. [facility]# [phone]

## 2013-11-05 ENCOUNTER — Telehealth: Payer: Self-pay

## 2013-11-05 DIAGNOSIS — F411 Generalized anxiety disorder: Secondary | ICD-10-CM

## 2013-11-05 MED ORDER — AMPHETAMINE-DEXTROAMPHETAMINE 15 MG PO TABS: 15.0000 mg | ORAL_TABLET | Freq: Two times a day (BID) | ORAL | Status: DC | PRN

## 2013-11-05 NOTE — Telephone Encounter (Signed)
Advised pt rx ready for pick up  

## 2013-11-05 NOTE — Telephone Encounter (Signed)
Pt states Hailey [name] usually rx's her something to help when she flies. She is flying in 2 weeks and requests phenergan and xanex.   Please call pt. [phone]  bf

## 2013-11-05 NOTE — Telephone Encounter (Signed)
Ready

## 2013-11-06 MED ORDER — ALPRAZOLAM 0.5 MG PO TABS: 0.5000 mg | ORAL_TABLET | Freq: Two times a day (BID) | ORAL | Status: DC | PRN

## 2013-11-06 MED ORDER — PROMETHAZINE HCL 25 MG PO TABS: 25.0000 mg | ORAL_TABLET | Freq: Four times a day (QID) | ORAL | Status: DC | PRN

## 2013-11-06 MED ORDER — ONDANSETRON 4 MG PO TBDP: 4.0000 mg | ORAL_TABLET | Freq: Three times a day (TID) | ORAL | Status: DC | PRN

## 2013-11-06 NOTE — Telephone Encounter (Signed)
OK - ready.  Please let her know I sent her in Phenergan and Zofran and she can use which ever she would like.

## 2013-11-06 NOTE — Telephone Encounter (Signed)
Pt advised.

## 2013-12-05 ENCOUNTER — Telehealth: Payer: Self-pay

## 2013-12-05 DIAGNOSIS — F988 Other specified behavioral and emotional disorders with onset usually occurring in childhood and adolescence: Secondary | ICD-10-CM

## 2013-12-05 NOTE — Telephone Encounter (Signed)
Adderrall refill  [phone]

## 2013-12-08 NOTE — Telephone Encounter (Signed)
Patient called on 12/05/2013 to get an Adderrall refill. Patient is completely out today. Can someone print this RX for her?  [phone]

## 2013-12-09 MED ORDER — AMPHETAMINE-DEXTROAMPHETAMINE 15 MG PO TABS: 15.0000 mg | ORAL_TABLET | Freq: Two times a day (BID) | ORAL | Status: DC | PRN

## 2013-12-09 NOTE — Telephone Encounter (Signed)
Notified pt ready. 

## 2013-12-09 NOTE — Telephone Encounter (Signed)
Ready

## 2014-01-06 ENCOUNTER — Other Ambulatory Visit: Payer: Self-pay

## 2014-01-06 DIAGNOSIS — F988 Other specified behavioral and emotional disorders with onset usually occurring in childhood and adolescence: Secondary | ICD-10-CM

## 2014-01-06 NOTE — Telephone Encounter (Signed)
Patient called requesting a refill of Adderol 15mg . Please call patient.     Thank You!

## 2014-01-07 MED ORDER — AMPHETAMINE-DEXTROAMPHETAMINE 15 MG PO TABS: 15.0000 mg | ORAL_TABLET | Freq: Two times a day (BID) | ORAL | Status: DC | PRN

## 2014-01-07 NOTE — Telephone Encounter (Signed)
Ready

## 2014-01-07 NOTE — Telephone Encounter (Signed)
Notified pt ready. 

## 2014-02-06 ENCOUNTER — Telehealth: Payer: Self-pay

## 2014-02-06 DIAGNOSIS — F988 Other specified behavioral and emotional disorders with onset usually occurring in childhood and adolescence: Secondary | ICD-10-CM

## 2014-02-06 MED ORDER — AMPHETAMINE-DEXTROAMPHETAMINE 15 MG PO TABS: 15.0000 mg | ORAL_TABLET | Freq: Two times a day (BID) | ORAL | Status: DC | PRN

## 2014-02-06 NOTE — Telephone Encounter (Signed)
Pt notified that rx is up front and ready for p.u

## 2014-02-06 NOTE — Telephone Encounter (Signed)
Done.  Ready for pick-up 

## 2014-02-06 NOTE — Telephone Encounter (Signed)
Patient called to request a refill Adderall 15 mg. Best contact [phone]

## 2014-03-05 ENCOUNTER — Ambulatory Visit (INDEPENDENT_AMBULATORY_CARE_PROVIDER_SITE_OTHER): Payer: BC Managed Care – PPO | Admitting: Physician Assistant

## 2014-03-05 ENCOUNTER — Encounter: Payer: Self-pay | Admitting: Physician Assistant

## 2014-03-05 VITALS — BP 110/68 | HR 82 | Temp 98.8°F | Resp 16 | Ht 65.5 in | Wt 118.2 lb

## 2014-03-05 DIAGNOSIS — F988 Other specified behavioral and emotional disorders with onset usually occurring in childhood and adolescence: Secondary | ICD-10-CM

## 2014-03-05 DIAGNOSIS — F411 Generalized anxiety disorder: Secondary | ICD-10-CM

## 2014-03-05 MED ORDER — ALPRAZOLAM 0.5 MG PO TABS: 0.5000 mg | ORAL_TABLET | Freq: Two times a day (BID) | ORAL | Status: DC | PRN

## 2014-03-05 MED ORDER — ONDANSETRON HCL 4 MG PO TABS: 4.0000 mg | ORAL_TABLET | Freq: Three times a day (TID) | ORAL | Status: DC | PRN

## 2014-03-05 MED ORDER — AMPHETAMINE-DEXTROAMPHETAMINE 15 MG PO TABS: 15.0000 mg | ORAL_TABLET | Freq: Two times a day (BID) | ORAL | Status: DC | PRN

## 2014-03-05 NOTE — Progress Notes (Signed)
   Subjective:    Patient ID: Hailey [name], female    DOB: 12-08-1980, 33 y.o.   MRN: [id number]  HPI Pt presents to clinic for med refill.  She feels like her dose of Adderall is working well.  She has some days that she feels like she needs a little more but it is rare and there are other days where she does not take the Rx dose.  She uses Xanax rarely.  She needs a refill of her Zofran because [place] by car and she gets motion sickness.  Review of Systems     Objective:   Physical Exam  Constitutional: She is oriented to person, place, and time. She appears well-developed and well-nourished.  HENT:  Head: Normocephalic and atraumatic.  Right Ear: External ear normal.  Left Ear: External ear normal.  Cardiovascular: Normal rate, regular rhythm and normal heart sounds.   No murmur heard. Pulmonary/Chest: Effort normal and breath sounds normal.  Neurological: She is alert and oriented to person, place, and time.  Skin: Skin is warm and dry.  Psychiatric: She has a normal mood and affect. Her behavior is normal. Judgment and thought content normal.        Assessment & Plan:  Generalized anxiety disorder - Plan: ALPRAZolam (XANAX) 0.5 MG tablet  Anxiety state, unspecified - Plan: ondansetron (ZOFRAN) 4 MG tablet  ADD (attention deficit disorder) - Plan: amphetamine-dextroamphetamine (ADDERALL) 15 MG tablet  Ok to refill meds for 6 months and then he will recheck at that time.  Benny LennertSarah Weber PA-C  [facility] 03/05/2014 1:44 PM

## 2014-04-14 ENCOUNTER — Telehealth: Payer: Self-pay

## 2014-04-14 DIAGNOSIS — F988 Other specified behavioral and emotional disorders with onset usually occurring in childhood and adolescence: Secondary | ICD-10-CM

## 2014-04-14 NOTE — Telephone Encounter (Signed)
PT IN NEED OF HER ADDERALL 15MG S. PLEASE CALL [phone]

## 2014-04-15 MED ORDER — AMPHETAMINE-DEXTROAMPHETAMINE 15 MG PO TABS: 15.0000 mg | ORAL_TABLET | Freq: Two times a day (BID) | ORAL | Status: DC | PRN

## 2014-04-15 NOTE — Telephone Encounter (Signed)
It has already been printed. Please see where the Rx is.

## 2014-04-16 NOTE — Telephone Encounter (Signed)
Advised pt. Rx in pick up drawer.

## 2014-04-23 ENCOUNTER — Telehealth: Payer: Self-pay

## 2014-04-23 NOTE — Telephone Encounter (Signed)
Lm for rtn call 

## 2014-04-23 NOTE — Telephone Encounter (Signed)
PT STATES SHE HAVE BEEN HAVING A VIBRATION IN HER EYES FOR OVER A WEEK AND WOULD LIKE TO SPEAK WITH SOMEONE ABOUT IT, ADVISED PT SHE CAN COME IN AND SEE SOMEONE BUT WOULD PREFER A CALL BACK, DOESN'T WANT TO PAY AND WE DO NOTHING FOR HER.  PLEASE CALL [phone]

## 2014-04-23 NOTE — Telephone Encounter (Signed)
Pt states this has been going on for 2 weeks she states that her eyes have been spasming. She states that the vibrations are not obscuring her vision. She does not have any pain associated with this, no headache. She states it is just annoying. Advised pt to increase water intake she reports she only drinks maybe a bottle a day of water. She is also going to try some lubricating eye drops. Advised pt to RTC if this continues.

## 2014-05-15 ENCOUNTER — Telehealth: Payer: Self-pay

## 2014-05-15 DIAGNOSIS — F988 Other specified behavioral and emotional disorders with onset usually occurring in childhood and adolescence: Secondary | ICD-10-CM

## 2014-05-15 NOTE — Telephone Encounter (Signed)
amphetamine-dextroamphetamine (ADDERALL) 15 MG tablet  refill   [phone]

## 2014-05-16 MED ORDER — AMPHETAMINE-DEXTROAMPHETAMINE 15 MG PO TABS: 15.0000 mg | ORAL_TABLET | Freq: Two times a day (BID) | ORAL | Status: DC | PRN

## 2014-05-16 NOTE — Telephone Encounter (Signed)
Meds ordered this encounter  Medications  . amphetamine-dextroamphetamine (ADDERALL) 15 MG tablet    Sig: Take 1 tablet (15 mg total) by mouth 2 (two) times daily as needed (for adhd symptoms).    Dispense:  60 tablet    Refill:  0    Order Specific Question:  Supervising Provider    Answer:  DOOLITTLE, ROBERT P [[id number]]

## 2014-05-16 NOTE — Telephone Encounter (Signed)
Notified pt ready. 

## 2014-06-15 ENCOUNTER — Telehealth: Payer: Self-pay

## 2014-06-15 DIAGNOSIS — F988 Other specified behavioral and emotional disorders with onset usually occurring in childhood and adolescence: Secondary | ICD-10-CM

## 2014-06-15 NOTE — Telephone Encounter (Signed)
Patient is needing a refill on her Adderall 15 mg    [facility]#: [phone]

## 2014-06-16 MED ORDER — AMPHETAMINE-DEXTROAMPHETAMINE 15 MG PO TABS: 15.0000 mg | ORAL_TABLET | Freq: Two times a day (BID) | ORAL | Status: DC | PRN

## 2014-06-16 NOTE — Telephone Encounter (Signed)
rx printed.  Meds ordered this encounter  Medications  . amphetamine-dextroamphetamine (ADDERALL) 15 MG tablet    Sig: Take 1 tablet by mouth 2 (two) times daily as needed (for adhd symptoms).    Dispense:  60 tablet    Refill:  0    Order Specific Question:  Supervising Provider    Answer:  DOOLITTLE, ROBERT P [[id number]]

## 2014-06-16 NOTE — Telephone Encounter (Signed)
Notified pt ready. 

## 2014-07-16 ENCOUNTER — Telehealth: Payer: Self-pay

## 2014-07-16 DIAGNOSIS — F988 Other specified behavioral and emotional disorders with onset usually occurring in childhood and adolescence: Secondary | ICD-10-CM

## 2014-07-16 NOTE — Telephone Encounter (Signed)
Request refill   amphetamine-dextroamphetamine (ADDERALL) 15 MG tablet   [phone]

## 2014-07-17 MED ORDER — AMPHETAMINE-DEXTROAMPHETAMINE 15 MG PO TABS: 15.0000 mg | ORAL_TABLET | Freq: Two times a day (BID) | ORAL | Status: DC | PRN

## 2014-07-17 NOTE — Telephone Encounter (Signed)
Pt advised. rx in pick up drawer. 

## 2014-07-17 NOTE — Telephone Encounter (Signed)
Ready

## 2014-08-15 ENCOUNTER — Telehealth: Payer: Self-pay

## 2014-08-15 DIAGNOSIS — F988 Other specified behavioral and emotional disorders with onset usually occurring in childhood and adolescence: Secondary | ICD-10-CM

## 2014-08-15 NOTE — Telephone Encounter (Signed)
Pt is requesting a refill of Adderall. °

## 2014-08-19 MED ORDER — AMPHETAMINE-DEXTROAMPHETAMINE 15 MG PO TABS: 15.0000 mg | ORAL_TABLET | Freq: Two times a day (BID) | ORAL | Status: DC | PRN

## 2014-08-19 NOTE — Telephone Encounter (Signed)
Notified pt ready. 

## 2014-08-19 NOTE — Telephone Encounter (Signed)
Done

## 2014-09-10 ENCOUNTER — Other Ambulatory Visit: Payer: Self-pay | Admitting: Physician Assistant

## 2014-09-10 DIAGNOSIS — F411 Generalized anxiety disorder: Secondary | ICD-10-CM

## 2014-09-10 NOTE — Telephone Encounter (Signed)
Refill on Xanax   [phone]

## 2014-09-12 MED ORDER — ALPRAZOLAM 0.5 MG PO TABS: 0.5000 mg | ORAL_TABLET | Freq: Two times a day (BID) | ORAL | Status: DC | PRN

## 2014-09-12 NOTE — Telephone Encounter (Signed)
Done

## 2014-09-12 NOTE — Telephone Encounter (Signed)
Faxed and notified pt

## 2014-09-19 ENCOUNTER — Other Ambulatory Visit: Payer: Self-pay

## 2014-09-19 DIAGNOSIS — F988 Other specified behavioral and emotional disorders with onset usually occurring in childhood and adolescence: Secondary | ICD-10-CM

## 2014-09-19 NOTE — Telephone Encounter (Signed)
Refill of adderall

## 2014-09-21 ENCOUNTER — Other Ambulatory Visit: Payer: Self-pay | Admitting: Physician Assistant

## 2014-09-23 MED ORDER — AMPHETAMINE-DEXTROAMPHETAMINE 15 MG PO TABS: 15.0000 mg | ORAL_TABLET | Freq: Two times a day (BID) | ORAL | Status: DC | PRN

## 2014-09-23 NOTE — Telephone Encounter (Signed)
Ready for pick up

## 2014-09-23 NOTE — Telephone Encounter (Signed)
This has been p/up already.

## 2014-10-22 ENCOUNTER — Telehealth: Payer: Self-pay

## 2014-10-22 DIAGNOSIS — F988 Other specified behavioral and emotional disorders with onset usually occurring in childhood and adolescence: Secondary | ICD-10-CM

## 2014-10-22 NOTE — Telephone Encounter (Signed)
Pt in need of her ADDERALL 15mg s, please call [phone] when ready for pick up

## 2014-10-23 MED ORDER — AMPHETAMINE-DEXTROAMPHETAMINE 15 MG PO TABS: 15.0000 mg | ORAL_TABLET | Freq: Two times a day (BID) | ORAL | Status: DC | PRN

## 2014-10-23 NOTE — Telephone Encounter (Signed)
Rx in pick up drawer. Pt advised.

## 2014-10-23 NOTE — Telephone Encounter (Signed)
Ready for pick up

## 2014-11-10 IMAGING — CT CT ABD-PELV W/ CM
2 of 4 series · 15 of 46 positions shown, 17 images · IV contrast (APPLIED)
Comparison: No priors.

CLINICAL DATA: Recurrent vomiting. Currently being treated for
urinary tract infection.

EXAM:
CT ABDOMEN AND PELVIS WITH CONTRAST
TECHNIQUE: Multidetector CT imaging of the abdomen and pelvis was performed
using the standard protocol following bolus administration of
intravenous contrast.
CONTRAST:  80mL OMNIPAQUE IOHEXOL 300 MG/ML  SOLN

[Series 2: abd/ pelvis 5.0 i30f 1 · axial · 0.69mm/px · z∈[-355,+40]mm · 12 of 87 slices shown, 14 images]
[im 4/87  soft-tissue]
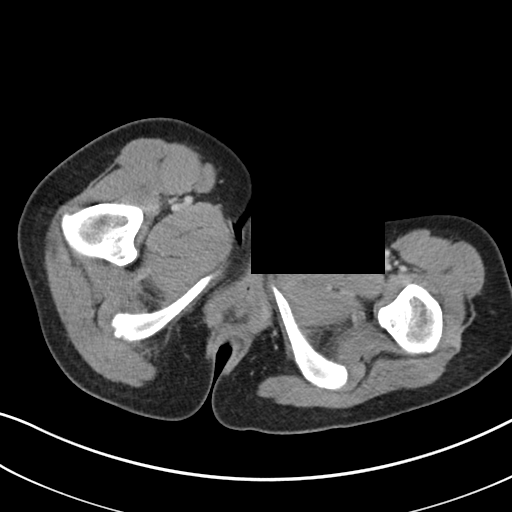
[im 4/87  bone]
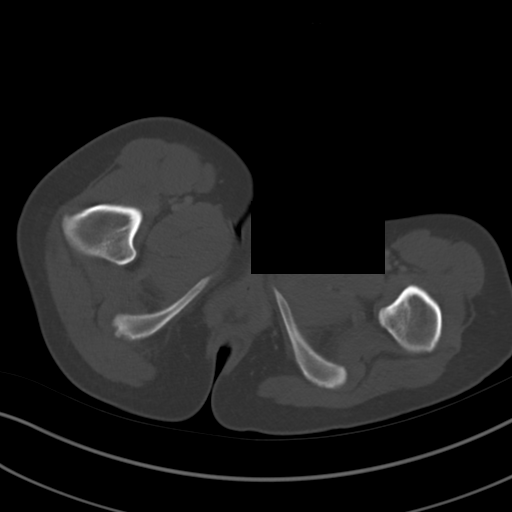
[im 11/87  soft-tissue]
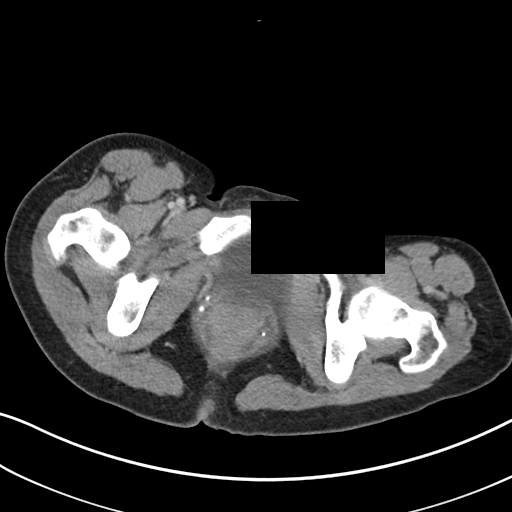
[im 18/87  soft-tissue]
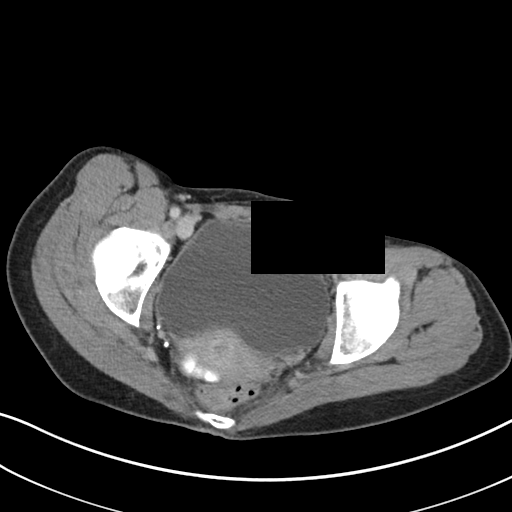
[im 26/87  soft-tissue]
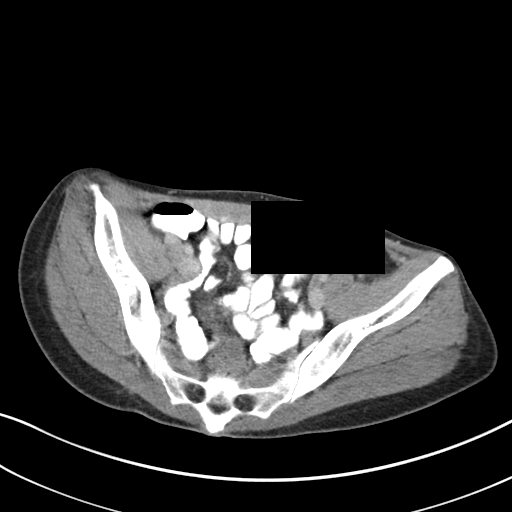
[im 33/87  soft-tissue]
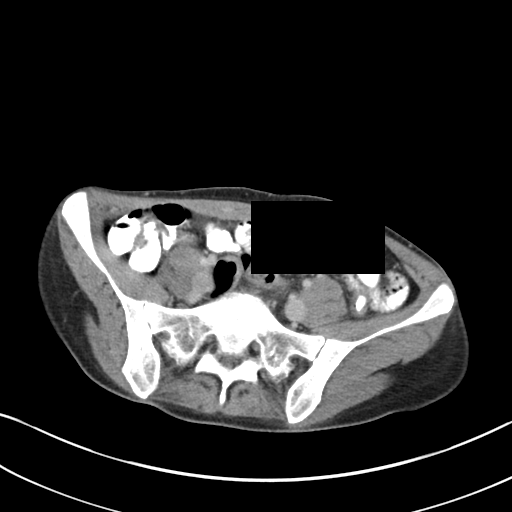
[im 40/87  soft-tissue]
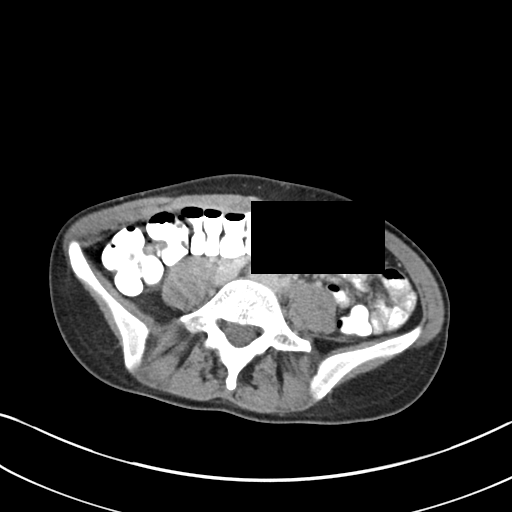
[im 47/87  soft-tissue]
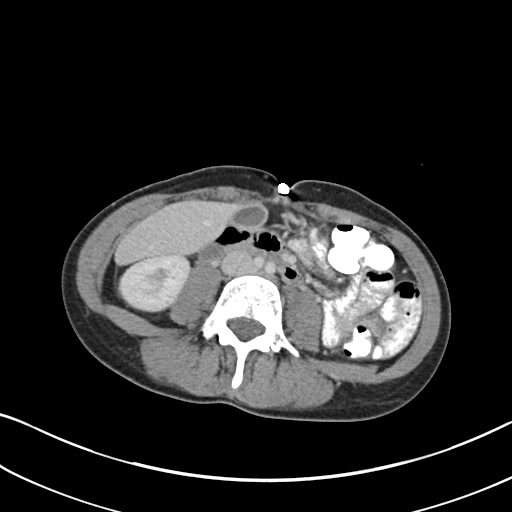
[im 54/87  soft-tissue]
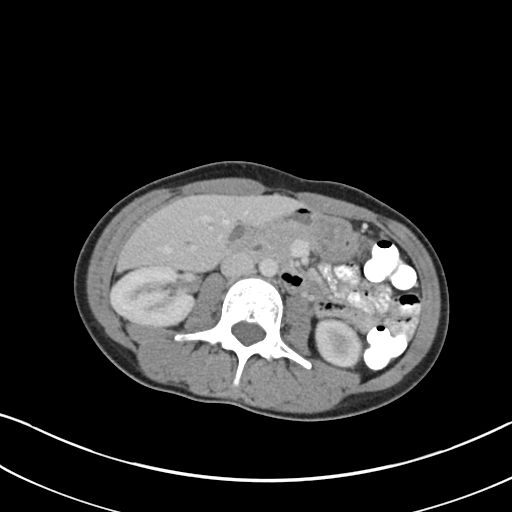
[im 61/87  soft-tissue]
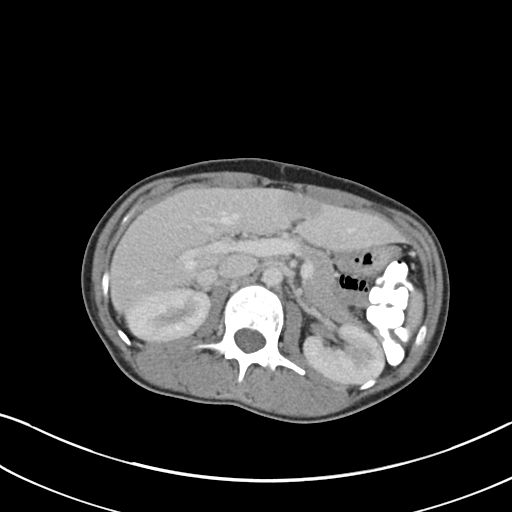
[im 61/87  bone]
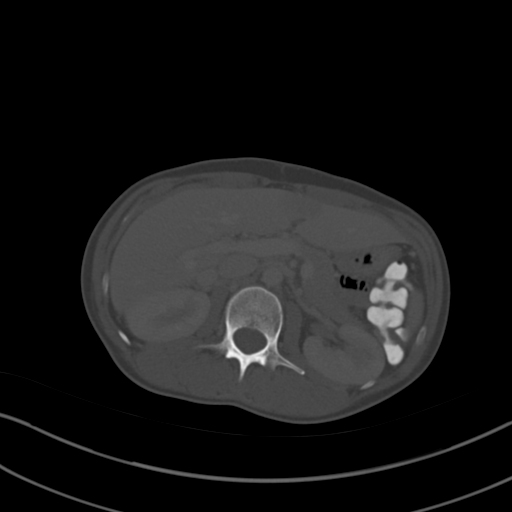
[im 69/87  soft-tissue]
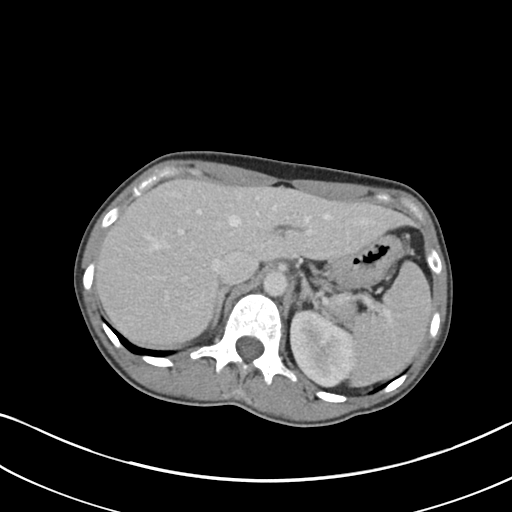
[im 76/87  soft-tissue]
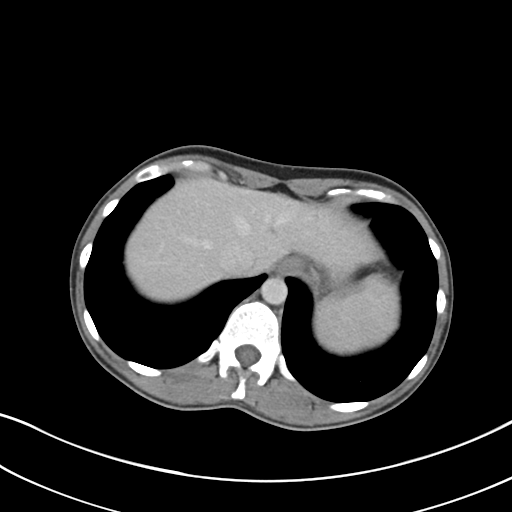
[im 83/87  soft-tissue]
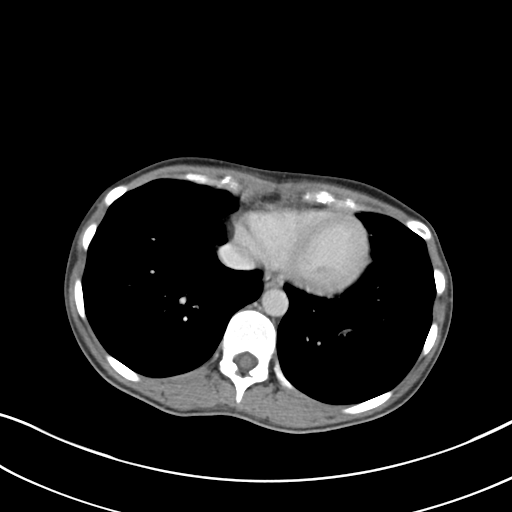

[Series 5: cor · coronal · 0.60mm/px · 3 of 86 slices shown]
[im 29/86  soft-tissue]
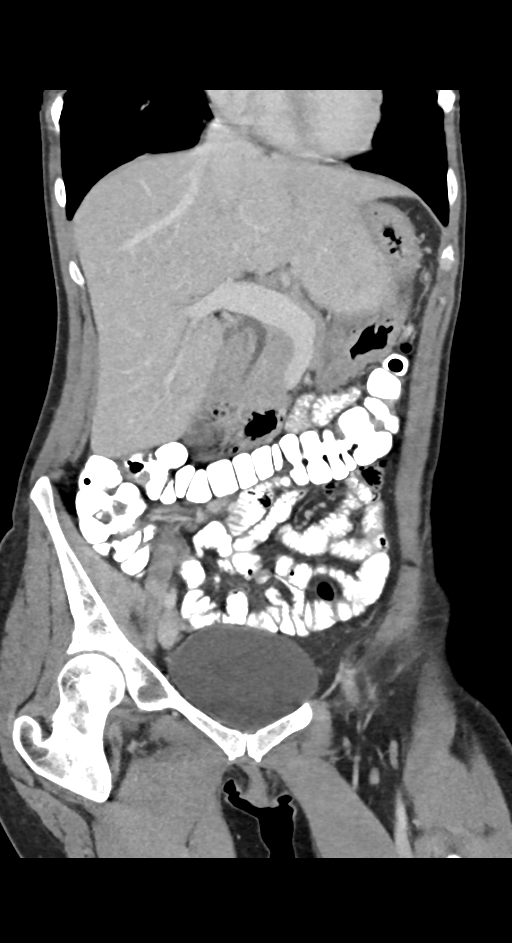
[im 38/86  soft-tissue]
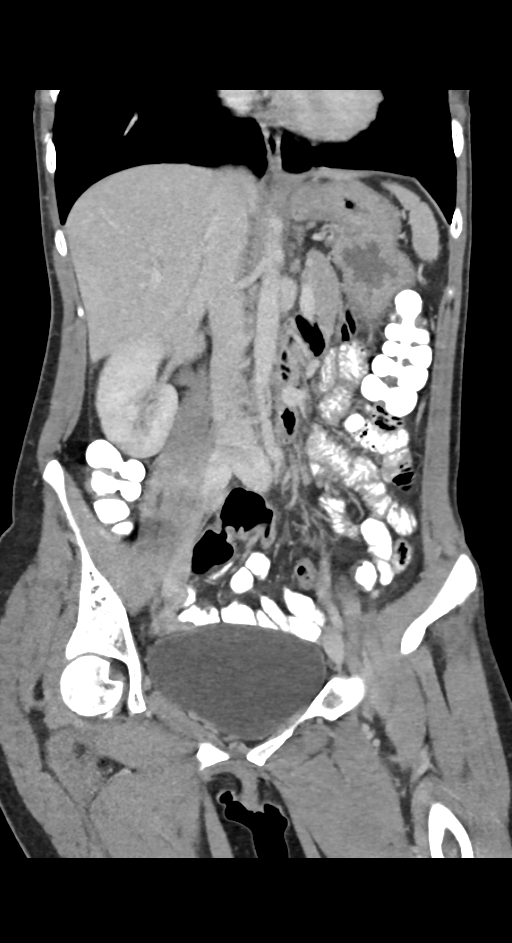
[im 48/86  soft-tissue]
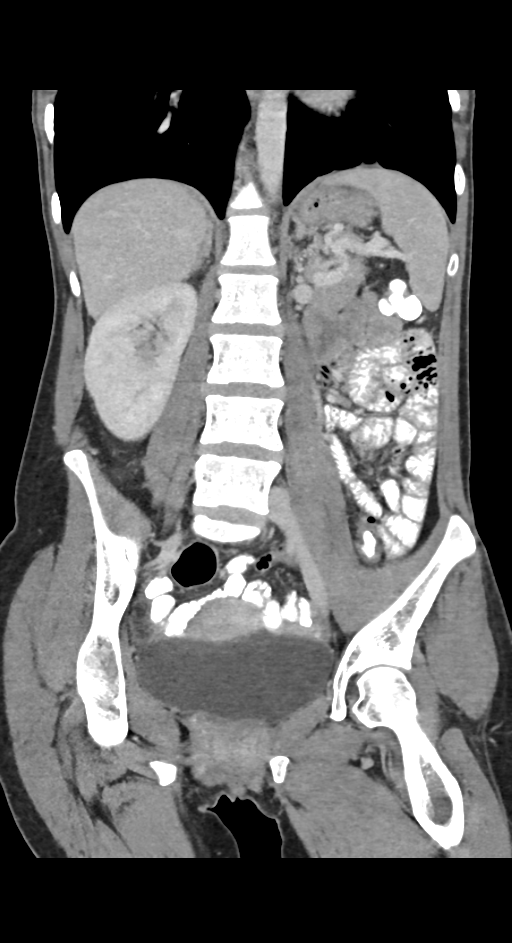

[15 of 46 positions shown; findings below may reference images not displayed]

FINDINGS: Lung Bases: Unremarkable.

Abdomen/Pelvis: The appearance of the liver, gallbladder, pancreas,
spleen, bilateral adrenal glands and the right kidney is
unremarkable. Sub cm low-attenuation lesion in the interpolar region
of the left kidney is too small to definitively characterize. 4 mm
nonobstructive calculus in the interpolar collecting system of the
left kidney.

No significant volume of ascites. No pneumoperitoneum. No pathologic
distention of small bowel. No definite lymphadenopathy identified
within the abdomen or pelvis. Normal appendix. Uterus and ovaries
are unremarkable in appearance. Urinary bladder is normal in
appearance.

Musculoskeletal: There are no aggressive appearing lytic or blastic
lesions noted in the visualized portions of the skeleton.
IMPRESSION: 1. No acute findings in the abdomen or pelvis to account for the
patient's symptoms. Specifically, the appendix is normal.
2. 4 mm nonobstructive calculus in the interpolar collecting system
of the left kidney incidentally noted.

## 2014-11-21 ENCOUNTER — Telehealth: Payer: Self-pay

## 2014-11-21 DIAGNOSIS — F988 Other specified behavioral and emotional disorders with onset usually occurring in childhood and adolescence: Secondary | ICD-10-CM

## 2014-11-21 NOTE — Telephone Encounter (Signed)
REFILL REQUEST   amphetamine-dextroamphetamine (ADDERALL) 15 MG tablet   [phone]

## 2014-11-25 MED ORDER — AMPHETAMINE-DEXTROAMPHETAMINE 15 MG PO TABS: 15.0000 mg | ORAL_TABLET | Freq: Two times a day (BID) | ORAL | Status: DC | PRN

## 2014-11-25 NOTE — Telephone Encounter (Signed)
Notified pt Rx ready and only needs to RTC bf NEXT RF due. Apologized for misunderstanding. Pt thanked us.

## 2014-11-25 NOTE — Telephone Encounter (Signed)
I had already written her the month that was just a reminder that I want to see her before she needs another one.  Sorry for the confusion.

## 2014-11-25 NOTE — Telephone Encounter (Signed)
Pt states she had requested before that we let her know when she is out of her meds and due for a visit, she have a child and just can't take off anytime for this, have mentioned this before Would like to have a few at least until able to come see the dr. It is her ADDERALL 15MG S and you may reach pt at [phone]

## 2014-11-25 NOTE — Telephone Encounter (Signed)
It is time for a visit 

## 2014-12-14 ENCOUNTER — Other Ambulatory Visit: Payer: Self-pay | Admitting: Physician Assistant

## 2014-12-16 NOTE — Telephone Encounter (Signed)
Called pt bc overdue for f/up and she agreed on last ph message to come in. Pt stated that she and husband are not trying to prevent pregnancy now and she does not need her BCPs but she will come in this weekend to see Maralyn SagoSarah for Adderall f/up.

## 2014-12-21 ENCOUNTER — Ambulatory Visit (INDEPENDENT_AMBULATORY_CARE_PROVIDER_SITE_OTHER): Payer: BLUE CROSS/BLUE SHIELD | Admitting: Physician Assistant

## 2014-12-21 VITALS — BP 116/76 | HR 74 | Temp 98.2°F | Resp 16 | Ht 66.25 in | Wt 118.2 lb

## 2014-12-21 DIAGNOSIS — M62838 Other muscle spasm: Secondary | ICD-10-CM

## 2014-12-21 DIAGNOSIS — M6248 Contracture of muscle, other site: Secondary | ICD-10-CM

## 2014-12-21 DIAGNOSIS — F988 Other specified behavioral and emotional disorders with onset usually occurring in childhood and adolescence: Secondary | ICD-10-CM

## 2014-12-21 DIAGNOSIS — M75102 Unspecified rotator cuff tear or rupture of left shoulder, not specified as traumatic: Secondary | ICD-10-CM

## 2014-12-21 DIAGNOSIS — F909 Attention-deficit hyperactivity disorder, unspecified type: Secondary | ICD-10-CM

## 2014-12-21 DIAGNOSIS — F411 Generalized anxiety disorder: Secondary | ICD-10-CM

## 2014-12-21 DIAGNOSIS — M7542 Impingement syndrome of left shoulder: Secondary | ICD-10-CM

## 2014-12-21 MED ORDER — CYCLOBENZAPRINE HCL 5 MG PO TABS: 5.0000 mg | ORAL_TABLET | Freq: Three times a day (TID) | ORAL | Status: DC | PRN

## 2014-12-21 MED ORDER — ALPRAZOLAM 0.5 MG PO TABS: 0.5000 mg | ORAL_TABLET | Freq: Two times a day (BID) | ORAL | Status: DC | PRN

## 2014-12-21 MED ORDER — IBUPROFEN 800 MG PO TABS: 800.0000 mg | ORAL_TABLET | Freq: Three times a day (TID) | ORAL | Status: DC | PRN

## 2014-12-21 MED ORDER — AMPHETAMINE-DEXTROAMPHETAMINE 15 MG PO TABS: 15.0000 mg | ORAL_TABLET | Freq: Two times a day (BID) | ORAL | Status: DC | PRN

## 2014-12-21 NOTE — Progress Notes (Signed)
Subjective:    Patient ID: Hailey [name], female    DOB: 04-May-1981, 34 y.o.   MRN: [id number]  HPI Pt presents to clinic with 2 concerns 1- f/u for her ADD - she is doing well on her medications.  There are some days when she uses less and then other days where she uses the full dose - they are trying to get pregnant and she is checking pregnancy tests frequently because she does not want to use the medications (Adderall or Xanax)  During her pregnancy but she knows she is a better person, mother and wife when she uses her Adderall.  She has been trying to decrease her dose and only use full doses when absolutely necessary.  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 2- she has been over the last 6 months been having left shoulder pain - that started in her muscles but now when ever she lifts her left arm she has a sharp pain in her shoulder.  She has done nothing for it but she does notice that she lifts her arm differently due to there pain.  She has never injured the arm before.  She has no weakness or paresthesias in her left arm.  Review of Systems  Musculoskeletal: Positive for myalgias.  Psychiatric/Behavioral: Positive for decreased concentration.       Objective:   Physical Exam  Constitutional: She is oriented to person, place, and time. She appears well-developed and well-nourished.  BP 116/76 mmHg  Pulse 74  Temp(Src) 98.2 F (36.8 C) (Oral)  Resp 16  Ht 5' 6.25" (1.683 m)  Wt 118 lb 4 oz (53.638 kg)  BMI 18.94 kg/m2  SpO2 99%  LMP 12/12/2014   HENT:  Head: Normocephalic and atraumatic.  Right Ear: External ear normal.  Left Ear: External ear normal.  Pulmonary/Chest: Effort normal.  Musculoskeletal:       Right shoulder: Normal.       Left shoulder: She exhibits tenderness and spasm (trapezius TTP ). She exhibits normal range  of motion and no bony tenderness.  Internal shoulder ROM - pt has pain Empty can - slight pain Impingment test - pain with internal rotation of shoulder  Neurological: She is alert and oriented to person, place, and time.  Skin: Skin is warm and dry.  Psychiatric: She has a normal mood and affect. Her behavior is normal. Judgment and thought content normal.       Assessment & Plan:  ADD (attention deficit disorder) - Plan: amphetamine-dextroamphetamine (ADDERALL) 15 MG tablet  Generalized anxiety disorder - Plan: ALPRAZolam (XANAX) 0.5 MG tablet  Rotator cuff impingement syndrome of left shoulder - Plan: ibuprofen (ADVIL,MOTRIN) 800 MG tablet, Ambulatory referral to Physical Therapy  Muscle spasms of neck - Plan: cyclobenzaprine (FLEXERIL) 5 MG tablet  OK to fill controlled substances for the next 6 months - we talked about her use of these medications with possible pregnancy and she is aware of the risks and benefits with pregnancy.  She plans to stop them both once her test is positive and she knows there is a time frame of pregnancy before the test is positive.  She would like to go to PT to help with her shoulder - we will start muscle relaxers and NSAIDs to help with her pain and start to decrease the stress on her shoulder  joint from the muscle spasm.  She will continue her stretching of her chest wall muscles.   [name] PA-C  [facility] 12/21/2014 1:56 PM

## 2014-12-23 NOTE — Progress Notes (Signed)
Left message for patient to call back to schedule appointment with Benny LennertSarah Weber.

## 2015-01-24 ENCOUNTER — Telehealth: Payer: Self-pay

## 2015-01-24 DIAGNOSIS — F988 Other specified behavioral and emotional disorders with onset usually occurring in childhood and adolescence: Secondary | ICD-10-CM

## 2015-01-24 NOTE — Telephone Encounter (Signed)
Pt is needing a refill on adderall °

## 2015-01-26 MED ORDER — AMPHETAMINE-DEXTROAMPHETAMINE 15 MG PO TABS: 15.0000 mg | ORAL_TABLET | Freq: Two times a day (BID) | ORAL | Status: DC | PRN

## 2015-01-26 NOTE — Telephone Encounter (Signed)
Pt called checking on status of this prescription. Please advise [phone]

## 2015-01-26 NOTE — Telephone Encounter (Signed)
I have refilled this medication, 60 pills with no refills. She just received 60 pills with no refills approx 5 weeks ago, I get the sense we expected this medication to last a bit longer than 5 weeks. She will need to come in to be seen for any further refills of her adderall. [facility]

## 2015-01-26 NOTE — Telephone Encounter (Signed)
Gave pt message. Pt is not happy about the message Tawanna Coolerodd said about her having to come in to be seen again. Please see previous note.

## 2015-01-26 NOTE — Telephone Encounter (Signed)
Can someone else please review this RF req in Sarah, and Chelle's, absence?

## 2015-01-27 ENCOUNTER — Telehealth: Payer: Self-pay

## 2015-01-27 NOTE — Telephone Encounter (Signed)
Called pt and advised of Chelle's message and she will let us know if she becomes pregnant.

## 2015-01-27 NOTE — Telephone Encounter (Signed)
I reviewed The last several notes, and Hailey [name]'s conversation with this patient regarding the Adderall.  Tawanna Coolerodd is correct, I think we were anticipating that the supply would last longer (she's cutting back,with plans to stop it when she becomes pregnant).    The patient is also correct that Maralyn SagoSarah said she could call for fills x 6 months (unless she becomes pregnant and stops the medication, of course!).  Please advise the patient that Maralyn SagoSarah and I will continue to fill this medication as agreed at her last visit.

## 2015-03-05 ENCOUNTER — Other Ambulatory Visit: Payer: Self-pay

## 2015-03-05 DIAGNOSIS — F988 Other specified behavioral and emotional disorders with onset usually occurring in childhood and adolescence: Secondary | ICD-10-CM

## 2015-03-05 NOTE — Telephone Encounter (Signed)
Pt would like a refill on her amphetamine-dextroamphetamine (ADDERALL) 15 MG tablet [[id number]. Please advise at [phone]

## 2015-03-06 MED ORDER — AMPHETAMINE-DEXTROAMPHETAMINE 15 MG PO TABS: 15.0000 mg | ORAL_TABLET | Freq: Two times a day (BID) | ORAL | Status: DC | PRN

## 2015-03-06 NOTE — Telephone Encounter (Signed)
ready

## 2015-03-06 NOTE — Telephone Encounter (Signed)
Pt.notified

## 2015-03-09 ENCOUNTER — Ambulatory Visit (HOSPITAL_COMMUNITY): Payer: BLUE CROSS/BLUE SHIELD | Admitting: Anesthesiology

## 2015-03-09 ENCOUNTER — Ambulatory Visit (HOSPITAL_COMMUNITY)
Admission: RE | Admit: 2015-03-09 | Discharge: 2015-03-09 | Disposition: A | Discharge status: Home / Self Care | Payer: BLUE CROSS/BLUE SHIELD | Source: Ambulatory Visit | Attending: Obstetrics and Gynecology | Admitting: Obstetrics and Gynecology

## 2015-03-09 ENCOUNTER — Encounter (HOSPITAL_COMMUNITY)
Admission: RE | Disposition: A | Discharge status: Home / Self Care | Payer: Self-pay | Source: Ambulatory Visit | Attending: Obstetrics and Gynecology

## 2015-03-09 ENCOUNTER — Other Ambulatory Visit: Payer: Self-pay | Admitting: Obstetrics and Gynecology

## 2015-03-09 DIAGNOSIS — O021 Missed abortion: Secondary | ICD-10-CM | POA: Insufficient documentation

## 2015-03-09 DIAGNOSIS — Z87891 Personal history of nicotine dependence: Secondary | ICD-10-CM | POA: Insufficient documentation

## 2015-03-09 HISTORY — PX: DILATION AND EVACUATION: SHX1459

## 2015-03-09 LAB — CBC
HCT: 41 % (ref 36.0–46.0)
Hemoglobin: 14.5 g/dL (ref 12.0–15.0)
MCH: 31.9 pg (ref 26.0–34.0)
MCHC: 35.4 g/dL (ref 30.0–36.0)
MCV: 90.3 fL (ref 78.0–100.0)
PLATELETS: 210 10*3/uL (ref 150–400)
RBC: 4.54 MIL/uL (ref 3.87–5.11)
RDW: 12.9 % (ref 11.5–15.5)
WBC: 8.6 10*3/uL (ref 4.0–10.5)

## 2015-03-09 SURGERY — DILATION AND EVACUATION, UTERUS
Anesthesia: Monitor Anesthesia Care | Site: Vagina

## 2015-03-09 MED ORDER — FENTANYL CITRATE (PF) 100 MCG/2ML IJ SOLN
INTRAMUSCULAR | Status: AC
  Filled 2015-03-09: qty 2

## 2015-03-09 MED ORDER — ONDANSETRON HCL 4 MG/2ML IJ SOLN
INTRAMUSCULAR | Status: AC
  Filled 2015-03-09: qty 2

## 2015-03-09 MED ORDER — SCOPOLAMINE 1 MG/3DAYS TD PT72
MEDICATED_PATCH | TRANSDERMAL | Status: AC
  Administered 2015-03-09: 1.5 mg via TRANSDERMAL
  Filled 2015-03-09: qty 1

## 2015-03-09 MED ORDER — BUPIVACAINE HCL (PF) 0.25 % IJ SOLN
INTRAMUSCULAR | Status: DC | PRN
Start: 2015-03-09 — End: 2015-03-09
  Administered 2015-03-09: 20 mL

## 2015-03-09 MED ORDER — MIDAZOLAM HCL 2 MG/2ML IJ SOLN
INTRAMUSCULAR | Status: DC | PRN
  Administered 2015-03-09: 2 mg via INTRAVENOUS

## 2015-03-09 MED ORDER — PROPOFOL 500 MG/50ML IV EMUL
INTRAVENOUS | Status: DC | PRN
  Administered 2015-03-09: 50 mg via INTRAVENOUS

## 2015-03-09 MED ORDER — HYDROCODONE-IBUPROFEN 7.5-200 MG PO TABS: 1.0000 | ORAL_TABLET | Freq: Three times a day (TID) | ORAL | Status: DC | PRN

## 2015-03-09 MED ORDER — ACETAMINOPHEN 160 MG/5ML PO SOLN: 325.0000 mg | ORAL | Status: DC | PRN

## 2015-03-09 MED ORDER — ACETAMINOPHEN 325 MG PO TABS: 325.0000 mg | ORAL_TABLET | ORAL | Status: DC | PRN

## 2015-03-09 MED ORDER — OXYCODONE-ACETAMINOPHEN 5-325 MG PO TABS
1.0000 | ORAL_TABLET | Freq: Once | ORAL | Status: AC
  Administered 2015-03-09: 1 via ORAL

## 2015-03-09 MED ORDER — PROPOFOL 10 MG/ML IV BOLUS
INTRAVENOUS | Status: AC
  Filled 2015-03-09: qty 40

## 2015-03-09 MED ORDER — SCOPOLAMINE 1 MG/3DAYS TD PT72
1.0000 | MEDICATED_PATCH | Freq: Once | TRANSDERMAL | Status: DC
  Administered 2015-03-09: 1.5 mg via TRANSDERMAL

## 2015-03-09 MED ORDER — DEXAMETHASONE SODIUM PHOSPHATE 10 MG/ML IJ SOLN
INTRAMUSCULAR | Status: AC
  Filled 2015-03-09: qty 1

## 2015-03-09 MED ORDER — DEXAMETHASONE SODIUM PHOSPHATE 10 MG/ML IJ SOLN
INTRAMUSCULAR | Status: DC | PRN
  Administered 2015-03-09: 4 mg via INTRAVENOUS

## 2015-03-09 MED ORDER — BUPIVACAINE HCL (PF) 0.25 % IJ SOLN
INTRAMUSCULAR | Status: AC
  Filled 2015-03-09: qty 30

## 2015-03-09 MED ORDER — FENTANYL CITRATE (PF) 100 MCG/2ML IJ SOLN
INTRAMUSCULAR | Status: DC | PRN
  Administered 2015-03-09: 100 ug via INTRAVENOUS

## 2015-03-09 MED ORDER — LACTATED RINGERS IV SOLN
INTRAVENOUS | Status: DC
  Administered 2015-03-09: 12:00:00 via INTRAVENOUS

## 2015-03-09 MED ORDER — KETOROLAC TROMETHAMINE 30 MG/ML IJ SOLN
30.0000 mg | Freq: Once | INTRAMUSCULAR | Status: DC | PRN
Start: 2015-03-09 — End: 2015-03-09

## 2015-03-09 MED ORDER — CEFAZOLIN SODIUM-DEXTROSE 2-3 GM-% IV SOLR: 2.0000 g | INTRAVENOUS | Status: DC

## 2015-03-09 MED ORDER — KETOROLAC TROMETHAMINE 30 MG/ML IJ SOLN
INTRAMUSCULAR | Status: DC | PRN
  Administered 2015-03-09: 30 mg via INTRAVENOUS

## 2015-03-09 MED ORDER — FENTANYL CITRATE (PF) 100 MCG/2ML IJ SOLN
25.0000 ug | INTRAMUSCULAR | Status: DC | PRN
Start: 2015-03-09 — End: 2015-03-09
  Administered 2015-03-09: 25 ug via INTRAVENOUS

## 2015-03-09 MED ORDER — OXYCODONE-ACETAMINOPHEN 5-325 MG PO TABS
ORAL_TABLET | ORAL | Status: AC
  Filled 2015-03-09: qty 1

## 2015-03-09 MED ORDER — CEFAZOLIN SODIUM-DEXTROSE 2-3 GM-% IV SOLR
INTRAVENOUS | Status: AC
  Administered 2015-03-09: 2 g via INTRAVENOUS
  Filled 2015-03-09: qty 50

## 2015-03-09 MED ORDER — LIDOCAINE HCL (CARDIAC) 20 MG/ML IV SOLN
INTRAVENOUS | Status: AC
  Filled 2015-03-09: qty 5

## 2015-03-09 MED ORDER — MIDAZOLAM HCL 2 MG/2ML IJ SOLN
INTRAMUSCULAR | Status: AC
  Filled 2015-03-09: qty 2

## 2015-03-09 MED ORDER — PROPOFOL INFUSION 10 MG/ML OPTIME
INTRAVENOUS | Status: DC | PRN
  Administered 2015-03-09: 75 ug/kg/min via INTRAVENOUS

## 2015-03-09 MED ORDER — LIDOCAINE HCL (CARDIAC) 20 MG/ML IV SOLN
INTRAVENOUS | Status: DC | PRN
  Administered 2015-03-09: 80 mg via INTRAVENOUS

## 2015-03-09 MED ORDER — ONDANSETRON HCL 4 MG/2ML IJ SOLN
INTRAMUSCULAR | Status: DC | PRN
  Administered 2015-03-09: 4 mg via INTRAVENOUS

## 2015-03-09 SURGICAL SUPPLY — 16 items
CATH ROBINSON RED A/P 16FR (CATHETERS) ×3 IMPLANT
CLOTH BEACON ORANGE TIMEOUT ST (SAFETY) ×3 IMPLANT
DECANTER SPIKE VIAL GLASS SM (MISCELLANEOUS) ×3 IMPLANT
GLOVE BIO SURGEON STRL SZ7.5 (GLOVE) ×3 IMPLANT
GOWN STRL REUS W/TWL LRG LVL3 (GOWN DISPOSABLE) ×6 IMPLANT
KIT BERKELEY 1ST TRIMESTER 3/8 (MISCELLANEOUS) ×3 IMPLANT
NS IRRIG 1000ML POUR BTL (IV SOLUTION) ×3 IMPLANT
PACK VAGINAL MINOR WOMEN LF (CUSTOM PROCEDURE TRAY) ×3 IMPLANT
PAD OB MATERNITY 4.3X12.25 (PERSONAL CARE ITEMS) ×3 IMPLANT
PAD PREP 24X48 CUFFED NSTRL (MISCELLANEOUS) ×3 IMPLANT
SET BERKELEY SUCTION TUBING (SUCTIONS) ×3 IMPLANT
TOWEL OR 17X24 6PK STRL BLUE (TOWEL DISPOSABLE) ×6 IMPLANT
VACURETTE 10 RIGID CVD (CANNULA) IMPLANT
VACURETTE 7MM CVD STRL WRAP (CANNULA) IMPLANT
VACURETTE 8 RIGID CVD (CANNULA) IMPLANT
VACURETTE 9 RIGID CVD (CANNULA) IMPLANT

## 2015-03-09 NOTE — H&P (Signed)
NAMDorathy [name]:  [name], Hailey          ACCOUNT NO.:  [id number]  MEDICAL RECORD NO.:  [id number]  LOCATION:  PERIO                         FACILITY:  [facility]  PHYSICIAN:  Lenoard Adenichard J. Murvin Gift, M.D.DATE OF BIRTH:  [birth year]  DATE OF ADMISSION:  03/05/2015 DATE OF DISCHARGE:                             HISTORY & PHYSICAL   CHIEF COMPLAINT:  Missed AB.  HISTORY OF PRESENT ILLNESS:  She is a 34 year old white female, G2, P1, with missed AB at 8 weeks' gestation.  Estimated fetal size 5 weeks. The patient has a history of fetal bradycardia and now recently with no heartbeat.  ALLERGIES:  The patient has no known drug allergies.  MEDICATIONS:  Prenatal vitamins.  SOCIAL HISTORY:  Nonsmoker.  Nondrinker.  She denies domestic or physical violence.  FAMILY HISTORY:  Noncontributory.  She has a previous history of C- section.  PHYSICAL EXAMINATION:  GENERAL:  She is a well-developed, well- nourished, white female, in no acute distress. HEENT:  Normal. NECK:  Supple.  Full range of motion. LUNGS:  Clear. HEART:  Regular rate and rhythm. ABDOMEN:  Soft, nontender. PELVIC:  Reveals slightly enlarged uterus and no adnexal masses. EXTREMITIES:  There are no cords. NEUROLOGIC:  Nonfocal. SKIN:  Intact.  IMPRESSION:  Missed AB at 8 weeks.  PLAN:  Proceed with suction D and E.  Risks of anesthesia, infection, bleeding, injury to surrounding organs, possible need for repair was discussed, delayed versus immediate complications to include bowel and bladder injury are noted.  The patient acknowledges and wishes to proceed.  Detail on copy of the surgery is at 1 o'clock today.     Lenoard Adenichard J. Merryn Thaker, M.D.     RJT/MEDQ  D:  03/09/2015  T:  03/09/2015  Job:  [id number]

## 2015-03-09 NOTE — Discharge Instructions (Signed)
DISCHARGE INSTRUCTIONS: D&E The following instructions have been prepared to help you care for yourself upon your return home.   **You may have Ibuprofen/Motrin/Advil/Aleve after 7:25 pm today**  Personal hygiene:  Use sanitary pads for vaginal drainage, not tampons.  Shower the day after your procedure.  NO tub baths, pools or Jacuzzis for 2-3 weeks.  Wipe front to back after using the bathroom.  Activity and limitations:  Do NOT drive or operate any equipment for 24 hours. The effects of anesthesia are still present and drowsiness may result.  Do NOT rest in bed all day.  Walking is encouraged.  Walk up and down stairs slowly.  You may resume your normal activity in one to two days or as indicated by your physician.  Sexual activity: NO intercourse for at least 2 weeks after the procedure, or as indicated by your physician.  Diet: Eat a light meal as desired this evening. You may resume your usual diet tomorrow.  Return to work: You may resume your work activities in one to two days or as indicated by your doctor.  What to expect after your surgery: Expect to have vaginal bleeding/discharge for 2-3 days and spotting for up to 10 days. It is not unusual to have soreness for up to 1-2 weeks. You may have a slight burning sensation when you urinate for the first day. Mild cramps may continue for a couple of days. You may have a regular period in 2-6 weeks.  Call your doctor for any of the following:  Excessive vaginal bleeding, saturating and changing one pad every hour.  Inability to urinate 6 hours after discharge from hospital.  Pain not relieved by pain medication.  Fever of 100.4 F or greater.  Unusual vaginal discharge or odor.   Patients signature: ______________________  Nurses signature ________________________  Support person's signature_______________________

## 2015-03-09 NOTE — Transfer of Care (Signed)
Immediate Anesthesia Transfer of Care Note  Patient: Hailey [name]  Procedure(s) Performed: Procedure(s): DILATATION AND EVACUATION (N/A)  Patient Location: PACU  Anesthesia Type:MAC  Level of Consciousness: awake, alert  and oriented  Airway & Oxygen Therapy: Patient Spontanous Breathing  Post-op Assessment: Report given to RN, Post -op Vital signs reviewed and stable and Patient moving all extremities  Post vital signs: Reviewed and stable  Last Vitals:  Filed Vitals:   03/09/15 1207  BP: 115/63  Pulse: 79  Temp: 36.6 C  Resp: 16    Complications: No apparent anesthesia complications

## 2015-03-09 NOTE — Progress Notes (Signed)
Patient ID: Hailey [name], female   DOB: 1981-03-10, 34 y.o.   MRN: [id number] Patient seen and examined. Consent witnessed and signed. No changes noted. Update completed.

## 2015-03-09 NOTE — Anesthesia Postprocedure Evaluation (Signed)
  Anesthesia Post-op Note  Patient: Hailey [name]  Procedure(s) Performed: Procedure(s): DILATATION AND EVACUATION (N/A) Patient is awake and responsive. Pain and nausea are reasonably well controlled. Vital signs are stable and clinically acceptable. Oxygen saturation is clinically acceptable. There are no apparent anesthetic complications at this time. Patient is ready for discharge.

## 2015-03-09 NOTE — Op Note (Signed)
03/09/2015  1:30 PM  PATIENT:  Hailey [name]  34 y.o. female  PRE-OPERATIVE DIAGNOSIS:  Missed Abortion  POST-OPERATIVE DIAGNOSIS:  Missed Abortion  PROCEDURE:  Procedure(s): DILATATION AND EVACUATION  SURGEON:  Surgeon(s): Olivia Mackieichard Harlie Ragle, MD  ASSISTANTS: none   ANESTHESIA:   local and general  ESTIMATED BLOOD LOSS: minimal  DRAINS: none   LOCAL MEDICATIONS USED:  MARCAINE    and Amount: 20 ml  SPECIMEN:  Source of Specimen:  POC  DISPOSITION OF SPECIMEN:  PATHOLOGY  COUNTS:  YES  DICTATION #: [id number]: [id number]  PLAN OF CARE: dc home  PATIENT DISPOSITION:  PACU - hemodynamically stable.

## 2015-03-09 NOTE — Anesthesia Preprocedure Evaluation (Signed)
Anesthesia Evaluation  Patient identified by MRN, date of birth, ID band Patient awake    Reviewed: Allergy & Precautions, NPO status , Patient's Chart, lab work & pertinent test results  History of Anesthesia Complications Negative for: history of anesthetic complications  Airway Mallampati: II  TM Distance: >3 FB Neck ROM: Full    Dental  (+) Teeth Intact   Pulmonary neg shortness of breath, neg sleep apnea, neg COPDneg recent URI, former smoker,  breath sounds clear to auscultation        Cardiovascular negative cardio ROS  Rhythm:Regular     Neuro/Psych PSYCHIATRIC DISORDERS Anxiety negative neurological ROS     GI/Hepatic negative GI ROS, Neg liver ROS,   Endo/Other  negative endocrine ROS  Renal/GU negative Renal ROS     Musculoskeletal   Abdominal   Peds  Hematology negative hematology ROS (+)   Anesthesia Other Findings   Reproductive/Obstetrics                             Anesthesia Physical Anesthesia Plan  ASA: II  Anesthesia Plan: MAC   Post-op Pain Management:    Induction: Intravenous  Airway Management Planned: Natural Airway and Nasal Cannula  Additional Equipment: None  Intra-op Plan:   Post-operative Plan:   Informed Consent: I have reviewed the patients History and Physical, chart, labs and discussed the procedure including the risks, benefits and alternatives for the proposed anesthesia with the patient or authorized representative who has indicated his/her understanding and acceptance.   Dental advisory given  Plan Discussed with: CRNA and Surgeon  Anesthesia Plan Comments:         Anesthesia Quick Evaluation

## 2015-03-10 ENCOUNTER — Encounter (HOSPITAL_COMMUNITY): Payer: Self-pay | Admitting: Obstetrics and Gynecology

## 2015-03-10 NOTE — Op Note (Signed)
NAMMarland Kitchen:  Hailey [name], Hailey [name]          ACCOUNT NO.:  [id number]  MEDICAL RECORD NO.:  [id number]  LOCATION:  [facility]                          [facility]ACILITY:  [facility]  PHYSICIAN:  Lenoard Adenichard J. Juandedios Dudash, M.D.DATE OF BIRTH:  [birth year]  DATE OF PROCEDURE:  03/09/2015 DATE OF DISCHARGE:  03/09/2015                              OPERATIVE REPORT   PREOPERATIVE DIAGNOSIS:  Missed abortion.  POSTOPERATIVE DIAGNOSIS:  Missed abortion.  PROCEDURE:  Suction dilation and evacuation.  SURGEON:  Lenoard Adenichard J. Summar Mcglothlin, M.D.  ASSISTANT:  None.  ANESTHESIA:  IV sedation and local.  ESTIMATED BLOOD LOSS:  Less than 50 mL.  COMPLICATIONS:  None.  DRAINS:  None.  COUNTS:  Correct.  SPECIMENS:  Products of conception to Pathology.  DISPOSITION:  The patient to recovery in good condition.  BRIEF OPERATIVE NOTE:  After being apprised of the risks of anesthesia, infection, bleeding, injury to surrounding organs, possible need for repair, delayed versus immediate complications to include bowel and bladder injury, possible need for repair, the patient was brought to the operating room where she was administered IV sedation without difficulty, prepped and draped in usual sterile fashion, catheterized until the bladder was empty.  Exam under anesthesia revealed a 6 to 8 week sized anteflexed uterus and no adnexal masses.  At this time, dilute paracervical block placed using 20 mL of dilute 0.25% Marcaine solution.  Cervix was easily dilated up to 23 Pratt dilator.  Curved suction 7 mm curette was placed, products of conception are aspirated without difficulty.  Repeat suction and curettage in a blunt four-quadrant method reveals the cavity to be empty.  Good hemostasis was noted.  All instruments were removed. The patient tolerated the procedure well, was awakened, and transferred to recovery in good condition.     Lenoard Adenichard J. Stana Bayon, M.D.     RJT/MEDQ  D:  03/09/2015  T:  03/10/2015  Job:  [id number]  cc:    Lenoard Adenichard J. Zykee Avakian, M.D. Fax: [phone]

## 2015-03-13 ENCOUNTER — Telehealth: Payer: Self-pay

## 2015-03-13 ENCOUNTER — Telehealth: Payer: Self-pay | Admitting: Family Medicine

## 2015-03-13 ENCOUNTER — Ambulatory Visit (INDEPENDENT_AMBULATORY_CARE_PROVIDER_SITE_OTHER): Payer: BLUE CROSS/BLUE SHIELD | Admitting: Physician Assistant

## 2015-03-13 VITALS — BP 122/72 | HR 80 | Temp 98.6°F | Resp 16 | Ht 66.0 in | Wt 123.0 lb

## 2015-03-13 DIAGNOSIS — Z9889 Other specified postprocedural states: Secondary | ICD-10-CM

## 2015-03-13 DIAGNOSIS — K5900 Constipation, unspecified: Secondary | ICD-10-CM

## 2015-03-13 DIAGNOSIS — R42 Dizziness and giddiness: Secondary | ICD-10-CM

## 2015-03-13 DIAGNOSIS — R110 Nausea: Secondary | ICD-10-CM | POA: Diagnosis not present

## 2015-03-13 DIAGNOSIS — R51 Headache: Secondary | ICD-10-CM

## 2015-03-13 DIAGNOSIS — R519 Headache, unspecified: Secondary | ICD-10-CM

## 2015-03-13 MED ORDER — KETOROLAC TROMETHAMINE 60 MG/2ML IM SOLN
60.0000 mg | Freq: Once | INTRAMUSCULAR | Status: AC
  Administered 2015-03-13: 60 mg via INTRAMUSCULAR

## 2015-03-13 MED ORDER — DOCUSATE SODIUM 100 MG PO CAPS: 300.0000 mg | ORAL_CAPSULE | Freq: Two times a day (BID) | ORAL | Status: DC

## 2015-03-13 MED ORDER — DIAZEPAM 2 MG PO TABS: 1.0000 mg | ORAL_TABLET | Freq: Three times a day (TID) | ORAL | Status: DC | PRN

## 2015-03-13 MED ORDER — ONDANSETRON 4 MG PO TBDP: 4.0000 mg | ORAL_TABLET | Freq: Three times a day (TID) | ORAL | Status: DC | PRN

## 2015-03-13 MED ORDER — TRAMADOL HCL 50 MG PO TABS: 50.0000 mg | ORAL_TABLET | Freq: Three times a day (TID) | ORAL | Status: DC | PRN

## 2015-03-13 NOTE — Telephone Encounter (Signed)
Called pt back at [phone].  Left message for pt to call us back. Trying to give pt Jethro PolingSarah Weber's message regarding her Ultram.

## 2015-03-13 NOTE — Progress Notes (Signed)
Subjective:    Patient ID: Hailey [name], female    DOB: [birth year], 34 y.o.   MRN: [id number]  HPI Pt presents to clinic with multiple concerns 1- vertigo  2- headache 3- nausea 4- s/p D&C 5- increase in anxiety She has been having room spinning intermittently for about 2 weeks.  She is also having left ear fullness and [facility] and was treated with abx for allergic sinusitis.  She has felt about the same since the abx.  She had a 7.5 week miscarriage and she is really upset since her D&C 4 days ago.  She is still really cramping and she has been scared to take the pain medication because some of it makes her itch, other she wonder might be making the dizziness worse.  Her headache this am was the worst it has been.  This am it was 10/10 ad now it is 5/10.  It is only on the left side of her head and throbbing.  She has also been nauseated and she is not sure if it is from the HA or the vertigo but she has taken nothing for it because she is not sure what to take because she does not want to feel worse than she already does. She is more anxious since her visit to [facility] and her vertigo and mostly after her D&C.  She is not sleeping well at night due to her nerves.   Review of Systems  Constitutional: Negative for fever and chills.  Gastrointestinal: Positive for nausea and constipation (with use of pain medications). Negative for vomiting.  Neurological: Positive for dizziness and headaches. Negative for weakness and numbness.   Patient Active Problem List   Diagnosis Date Noted  . Generalized anxiety disorder 09/04/2013  . Nausea and vomiting 08/25/2013   Prior to Admission medications   Medication Sig Start Date End Date Taking? Authorizing Provider  HYDROcodone-ibuprofen (VICOPROFEN) 7.5-200 MG per tablet Take 1 tablet by mouth every 8 (eight) hours as needed for moderate pain. Patient not taking: Reported on 03/13/2015 03/09/15   Olivia Mackieichard Taavon, MD    ondansetron (ZOFRAN ODT) 4 MG disintegrating tablet Take 1 tablet (4 mg total) by mouth every 8 (eight) hours as needed for nausea. 03/13/15   Morrell RiddleSarah L Weber, PA-C   No Known Allergies  Medications, allergies, past medical history, surgical history, family history, social history and problem list reviewed and updated.      Objective:   Physical Exam  Constitutional: She is oriented to person, place, and time. She appears well-developed and well-nourished.  BP 122/72 mmHg  Pulse 80  Temp(Src) 98.6 F (37 C) (Oral)  Resp 16  Ht 5\' 6"  (1.676 m)  Wt 123 lb (55.792 kg)  BMI 19.86 kg/m2  SpO2 99%  LMP  (LMP Unknown)   HENT:  Head: Normocephalic and atraumatic.  Right Ear: Hearing, tympanic membrane, external ear and ear canal normal.  Left Ear: Hearing, tympanic membrane, external ear and ear canal normal.  Nose: Mucosal edema (mild paleness) present.  Mouth/Throat: Uvula is midline, oropharynx is clear and moist and mucous membranes are normal.  Both ears has slight bulging with serous fluid representing ETD  Eyes: Conjunctivae and EOM are normal. Pupils are equal, round, and reactive to light. Right eye exhibits no nystagmus. Left eye exhibits no nystagmus.  Neck: Normal range of motion. Neck supple.  Cardiovascular: Normal rate, regular rhythm and normal heart sounds.   No murmur heard. Pulmonary/Chest: Effort  normal and breath sounds normal. She has no wheezes.  Lymphadenopathy:    She has no cervical adenopathy.  Neurological: She is alert and oriented to person, place, and time. She has normal strength and normal reflexes. No cranial nerve deficit or sensory deficit. She displays a negative Romberg sign. Coordination and gait normal.  Skin: Skin is warm and dry.  Psychiatric: She has a normal mood and affect. Her behavior is normal. Judgment and thought content normal.  Tearful in the room       Assessment & Plan:  Acute nonintractable headache, unspecified headache type -  Plan: ketorolac (TORADOL) injection 60 mg  S/P dilation and curettage - Plan: ketorolac (TORADOL) injection 60 mg, traMADol (ULTRAM) 50 MG tablet  Nausea without vomiting - Plan: ondansetron (ZOFRAN ODT) 4 MG disintegrating tablet  Vertigo - Plan: diazepam (VALIUM) 2 MG tablet  Constipation, unspecified constipation type - Plan: docusate sodium (COLACE) 100 MG capsule   Pt has multiple symptoms that could be related.  I suspect she is having some type of BPPV maybe in relation to her mild ETD and then a migraine HA on top maybe related to the stress of her procedure, lack of sleep and possibly the vertigo.  Her nausea is related to her vertigo and HA.  Today the vertigo is slightly better but the HA is worse but her Advil cold and sinus that she took earlier today has helped relieve the HA.  We will treat her symptoms at this time due to her normal neurological exam.  We will recheck in 10 days.  She can also add Zyrtec D to help with congestion and ETD.  D/w Dr Edrick Kinsaub  Sarah Weber PA-C  [facility] 03/13/2015 3:35 PM

## 2015-03-13 NOTE — Telephone Encounter (Signed)
I would have her try the Ultram for the pain and not use the medications that she was given after her procedure.

## 2015-03-13 NOTE — Telephone Encounter (Signed)
Benny LennertSarah [name]   Patient has questions regarding her medication.    [phone]

## 2015-03-13 NOTE — Telephone Encounter (Signed)
I phoned pt to clarify that Tramadol should be taken in place of Vicoprofen. She will also take Zofran and Valium as prescribed for HA and associated symptoms. She expresses understanding.

## 2015-03-13 NOTE — Patient Instructions (Addendum)
Colace - stool softener Appt in about 10 days with me for a recheck

## 2015-03-14 NOTE — Telephone Encounter (Signed)
LMOM to call back

## 2015-03-20 ENCOUNTER — Telehealth: Payer: Self-pay

## 2015-03-20 NOTE — Telephone Encounter (Signed)
Spoke with pt. She is still having headaches. She says it switches sides, at one time it'll be on the left, other times it'll be on the right. She has tried so many medicines and has been resting and staying hydrated. Pt wants to know what else she should do.

## 2015-03-20 NOTE — Telephone Encounter (Signed)
Patient still has headaches , tried everything , is looking forward to going out of town tomorrow, but may not be able to without some relief of her headache.   Please call [phone]

## 2015-03-22 NOTE — Telephone Encounter (Signed)
Called patient and left VM. At this point, this is out of the realm of urgent care/primary care. I would offer her a head CT to rule out an intracranial process and definitely think that at this point she needs to either go to the ED where this can be facilitated much easier or we can refer her to neuro urgently. Please let me know what patient would like to do. Thank you!

## 2015-03-24 NOTE — Telephone Encounter (Signed)
Left message for pt to call back  °

## 2015-03-25 ENCOUNTER — Encounter: Payer: Self-pay | Admitting: Physician Assistant

## 2015-03-25 ENCOUNTER — Ambulatory Visit (INDEPENDENT_AMBULATORY_CARE_PROVIDER_SITE_OTHER): Payer: BLUE CROSS/BLUE SHIELD | Admitting: Physician Assistant

## 2015-03-25 VITALS — BP 112/74 | HR 75 | Temp 98.8°F | Resp 16 | Ht 66.0 in | Wt 123.0 lb

## 2015-03-25 DIAGNOSIS — R51 Headache: Secondary | ICD-10-CM

## 2015-03-25 DIAGNOSIS — R519 Headache, unspecified: Secondary | ICD-10-CM

## 2015-03-25 DIAGNOSIS — R42 Dizziness and giddiness: Secondary | ICD-10-CM

## 2015-03-26 NOTE — Progress Notes (Signed)
   Hailey [name]  MRN: [id number] DOB: 28-Dec-1980  Subjective:  Pt presents to clinic for a recheck.  The valium really helped with the headache.  The headache has been gone for 3 days.  She feels really good.  Her post-surgical pain has resolved. She has had no episodes of dizziness since I saw her last week.  She feels really good.  She has restarted her Adderall.    Patient Active Problem List   Diagnosis Date Noted  . Generalized anxiety disorder 09/04/2013  . Nausea and vomiting 08/25/2013    Past Medical History  Diagnosis Date  . Anxiety     Panic attacks ~ 2005  . ADD (attention deficit disorder)     Outpatient Prescriptions Prior to Visit  Medication Sig Dispense Refill  . diazepam (VALIUM) 2 MG tablet Take 0.5-1 tablets (1-2 mg total) by mouth every 8 (eight) hours as needed for anxiety. 30 tablet 0  . docusate sodium (COLACE) 100 MG capsule Take 3 capsules (300 mg total) by mouth 2 (two) times daily. 30 capsule 0  . ondansetron (ZOFRAN ODT) 4 MG disintegrating tablet Take 1 tablet (4 mg total) by mouth every 8 (eight) hours as needed for nausea. 20 tablet 0  . HYDROcodone-ibuprofen (VICOPROFEN) 7.5-200 MG per tablet Take 1 tablet by mouth every 8 (eight) hours as needed for moderate pain. (Patient not taking: Reported on 03/13/2015) 30 tablet 0  . traMADol (ULTRAM) 50 MG tablet Take 1 tablet (50 mg total) by mouth every 8 (eight) hours as needed. (Patient not taking: Reported on 03/25/2015) 30 tablet 0   No facility-administered medications prior to visit.    No Known Allergies  Review of Systems  Neurological: Negative for dizziness and headaches.   Objective:  Physical Exam  Constitutional: She is oriented to person, place, and time and well-developed, well-nourished, and in no distress.  HENT:  Head: Normocephalic and atraumatic.  Right Ear: Hearing and external ear normal.  Left Ear: Hearing and external ear normal.  Eyes: Conjunctivae are normal.  Neck:  Normal range of motion.  Cardiovascular: Normal rate, regular rhythm and normal heart sounds.   No murmur heard. Pulmonary/Chest: Effort normal and breath sounds normal.  Neurological: She is alert and oriented to person, place, and time. Gait normal.  Skin: Skin is warm and dry.  Psychiatric: Mood, memory, affect and judgment normal.    Assessment and Plan :  Acute nonintractable headache, unspecified headache type  Vertigo  Resolved.  Pt has restarted her PT and she plans to continue.  Benny LennertSarah Jackelynn Hosie PA-C  [facility] 03/26/2015 5:20 PM

## 2015-03-26 NOTE — Telephone Encounter (Signed)
Pt seen on 03/25/15.

## 2015-04-13 ENCOUNTER — Telehealth: Payer: Self-pay

## 2015-04-13 NOTE — Telephone Encounter (Signed)
Pt requesting Adderall   Best phone [phone]

## 2015-04-15 MED ORDER — AMPHETAMINE-DEXTROAMPHETAMINE 15 MG PO TABS: 15.0000 mg | ORAL_TABLET | Freq: Two times a day (BID) | ORAL | Status: DC

## 2015-04-15 NOTE — Telephone Encounter (Signed)
Ready for pick up

## 2015-04-16 NOTE — Telephone Encounter (Signed)
Left message Rx ready for pick up 

## 2015-05-15 ENCOUNTER — Telehealth: Payer: Self-pay

## 2015-05-15 NOTE — Telephone Encounter (Signed)
REFILL REQUEST   amphetamine-dextroamphetamine (ADDERALL) 15 MG tablet   [phone]  [phone] (H)

## 2015-05-18 MED ORDER — AMPHETAMINE-DEXTROAMPHETAMINE 15 MG PO TABS: 15.0000 mg | ORAL_TABLET | Freq: Two times a day (BID) | ORAL | Status: DC

## 2015-05-18 NOTE — Telephone Encounter (Signed)
Meds ordered this encounter  Medications  . amphetamine-dextroamphetamine (ADDERALL) 15 MG tablet    Sig: Take 1 tablet by mouth 2 (two) times daily.    Dispense:  60 tablet    Refill:  0    Order Specific Question:  Supervising Provider    Answer:  DOOLITTLE, ROBERT P [[id number]]

## 2015-05-18 NOTE — Telephone Encounter (Signed)
Called pt and notified that Rx is ready.

## 2015-06-05 ENCOUNTER — Ambulatory Visit (INDEPENDENT_AMBULATORY_CARE_PROVIDER_SITE_OTHER): Payer: BLUE CROSS/BLUE SHIELD | Admitting: Physician Assistant

## 2015-06-05 DIAGNOSIS — Z658 Other specified problems related to psychosocial circumstances: Secondary | ICD-10-CM

## 2015-06-05 DIAGNOSIS — M62838 Other muscle spasm: Secondary | ICD-10-CM

## 2015-06-05 DIAGNOSIS — F439 Reaction to severe stress, unspecified: Secondary | ICD-10-CM

## 2015-06-05 DIAGNOSIS — M25512 Pain in left shoulder: Secondary | ICD-10-CM | POA: Diagnosis not present

## 2015-06-05 MED ORDER — ALPRAZOLAM 0.5 MG PO TABS: 0.5000 mg | ORAL_TABLET | Freq: Every evening | ORAL | Status: DC | PRN

## 2015-06-05 MED ORDER — DIAZEPAM 2 MG PO TABS: 1.0000 mg | ORAL_TABLET | Freq: Three times a day (TID) | ORAL | Status: DC | PRN
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
## 2015-06-19 ENCOUNTER — Telehealth: Payer: Self-pay

## 2015-06-19 DIAGNOSIS — F988 Other specified behavioral and emotional disorders with onset usually occurring in childhood and adolescence: Secondary | ICD-10-CM

## 2015-06-19 NOTE — Telephone Encounter (Signed)
Patient is requesting a medication refill for aderral. Please call when ready for pick up! [phone]

## 2015-06-22 MED ORDER — AMPHETAMINE-DEXTROAMPHETAMINE 15 MG PO TABS: 15.0000 mg | ORAL_TABLET | Freq: Two times a day (BID) | ORAL | Status: DC

## 2015-06-22 NOTE — Telephone Encounter (Signed)
Refilled #60 tabs adderall. She will need to get further refills from Sarah/discuss if an OV is needed for further refills since she has not been seen specifically for ADD in >6 months. She did have a recent unrelated OV with Sarah though.

## 2015-06-22 NOTE — Telephone Encounter (Signed)
Pt called again about the Adderall referal. She ran out on the 27th and really needs this. Pt is upset that is have been 3 days since she put in the initial request. PLEASE CALL.

## 2015-06-22 NOTE — Telephone Encounter (Signed)
Can someone help Hailey [name]?

## 2015-06-23 NOTE — Telephone Encounter (Signed)
Left message Rx ready to pick up and to make appt before refills run out.

## 2015-07-23 ENCOUNTER — Ambulatory Visit (INDEPENDENT_AMBULATORY_CARE_PROVIDER_SITE_OTHER): Payer: BLUE CROSS/BLUE SHIELD | Admitting: Physician Assistant

## 2015-07-23 ENCOUNTER — Encounter: Payer: Self-pay | Admitting: Physician Assistant

## 2015-07-23 VITALS — BP 122/79 | HR 84 | Temp 99.0°F | Resp 16 | Ht 66.0 in | Wt 118.0 lb

## 2015-07-23 DIAGNOSIS — F988 Other specified behavioral and emotional disorders with onset usually occurring in childhood and adolescence: Secondary | ICD-10-CM

## 2015-07-23 DIAGNOSIS — Z658 Other specified problems related to psychosocial circumstances: Secondary | ICD-10-CM | POA: Diagnosis not present

## 2015-07-23 DIAGNOSIS — F909 Attention-deficit hyperactivity disorder, unspecified type: Secondary | ICD-10-CM

## 2015-07-23 DIAGNOSIS — F439 Reaction to severe stress, unspecified: Secondary | ICD-10-CM

## 2015-07-23 MED ORDER — ALPRAZOLAM 0.5 MG PO TABS: 0.5000 mg | ORAL_TABLET | Freq: Every evening | ORAL | Status: DC | PRN

## 2015-07-23 MED ORDER — AMPHETAMINE-DEXTROAMPHETAMINE 15 MG PO TABS: 15.0000 mg | ORAL_TABLET | Freq: Two times a day (BID) | ORAL | Status: DC
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
## 2015-07-24 ENCOUNTER — Telehealth: Payer: Self-pay

## 2015-07-24 NOTE — Telephone Encounter (Signed)
Patient called back to see if there has been a response. I gave her the message Maralyn Sago put in. Patient understood.

## 2015-07-24 NOTE — Telephone Encounter (Signed)
Due to the length of time this should not be a bacterial infection.  Her daughter has similar symptoms and they are most likely sharing a virus.  She should treat her fever and any myalgias with tylenol and or motrin and she should use mucinex (plain blue box) to help with her mucus.  She should let me know in a week is she is still having symptoms.

## 2015-07-24 NOTE — Telephone Encounter (Signed)
Spoke with pt, she states she was here yesterday for another issue but states she was running a low grade fever when she was here. She woke up with a fever and green mucus coming from her nose. She states she is prone to sinus infection and wanted to see if Maralyn Sago could call her in an antibiotic since she was having the symptoms yesterday. Please advise.

## 2015-08-22 ENCOUNTER — Telehealth: Payer: Self-pay

## 2015-08-22 DIAGNOSIS — F988 Other specified behavioral and emotional disorders with onset usually occurring in childhood and adolescence: Secondary | ICD-10-CM

## 2015-08-22 NOTE — Telephone Encounter (Signed)
Patient needs her amphetamine-dextroamphetamine (ADDERALL) 15 MG tablet refilled. Stated she is almost out.

## 2015-08-25 MED ORDER — AMPHETAMINE-DEXTROAMPHETAMINE 15 MG PO TABS: 15.0000 mg | ORAL_TABLET | Freq: Two times a day (BID) | ORAL | Status: DC

## 2015-08-25 NOTE — Telephone Encounter (Signed)
Done - I will sign after clinic

## 2015-08-25 NOTE — Telephone Encounter (Signed)
Notified pt ready, and she had already p/up.

## 2015-09-26 ENCOUNTER — Telehealth: Payer: Self-pay

## 2015-09-26 DIAGNOSIS — F988 Other specified behavioral and emotional disorders with onset usually occurring in childhood and adolescence: Secondary | ICD-10-CM

## 2015-09-26 NOTE — Telephone Encounter (Signed)
PATIENT WOULD LIKE Hailey [name] TO KNOW THAT IT IS TIME TO REFILL HER ADDERALL 15 MG. BEST PHONE [phone] ([phone]) MBC

## 2015-10-01 MED ORDER — AMPHETAMINE-DEXTROAMPHETAMINE 15 MG PO TABS: 15.0000 mg | ORAL_TABLET | Freq: Two times a day (BID) | ORAL | Status: DC

## 2015-10-01 NOTE — Telephone Encounter (Signed)
Notified pt on VM ready. 

## 2015-10-01 NOTE — Telephone Encounter (Signed)
Done

## 2015-10-02 ENCOUNTER — Telehealth: Payer: Self-pay

## 2015-10-02 NOTE — Telephone Encounter (Signed)
Hailey [name]   Patient had her second miscarriage.  She took valium for migraine.  Had one the day before and again today.  Nauseaing pain.  D/C last week   [phone]

## 2015-10-03 ENCOUNTER — Other Ambulatory Visit: Payer: Self-pay | Admitting: Physician Assistant

## 2015-10-03 DIAGNOSIS — G4489 Other headache syndrome: Secondary | ICD-10-CM

## 2015-10-03 DIAGNOSIS — F411 Generalized anxiety disorder: Secondary | ICD-10-CM

## 2015-10-03 MED ORDER — DIAZEPAM 2 MG PO TABS: 2.0000 mg | ORAL_TABLET | Freq: Three times a day (TID) | ORAL | Status: DC | PRN

## 2015-10-03 NOTE — Telephone Encounter (Signed)
See message 10/03/15 from AT&TStephanie English PA-C. [facility] [place] [address] Patient notified

## 2015-10-03 NOTE — Progress Notes (Unsigned)
[facility] [place] [address]

## 2015-10-03 NOTE — Progress Notes (Unsigned)
[facility] [address], [place] [id number] [phone]  Date:  10/03/2015   Name:  Hailey [name]   DOB:  07/30/1981   MRN:  [id number]  PCP:  No PCP Per Patient    History of Present Illness:  Hailey [name] is a 34 y.o. female patient who [facility] to [facility]     Patient Active Problem List   Diagnosis Date Noted  . Generalized anxiety disorder 09/04/2013  . Nausea and vomiting 08/25/2013    Past Medical History  Diagnosis Date  . Anxiety     Panic attacks ~ 2005  . ADD (attention deficit disorder)     Past Surgical History  Procedure Laterality Date  . Hernia repair    . Cesarean section  06/2010    g2 p1 as of 08/2013.   Marland Kitchen. Therapeutic abortion    . Dilation and evacuation N/A 03/09/2015    Procedure: DILATATION AND EVACUATION;  Surgeon: Olivia Mackieichard Taavon, MD;  Location: [facility];  Service: Gynecology;  Laterality: N/A;    Social History  Substance Use Topics  . Smoking status: Former Smoker    Types: Cigarettes    Quit date: 11/12/2008  . Smokeless tobacco: Never Used  . Alcohol Use: 1.2 oz/week    2 Standard drinks or equivalent per week    No family history on file.  No Known Allergies  Medication list has been reviewed and updated.  Current Outpatient Prescriptions on File Prior to Visit  Medication Sig Dispense Refill  . ALPRAZolam (XANAX) 0.5 MG tablet Take 1 tablet (0.5 mg total) by mouth at bedtime as needed for anxiety. 30 tablet 0  . amphetamine-dextroamphetamine (ADDERALL) 15 MG tablet Take 1 tablet by mouth 2 (two) times daily. 60 tablet 0  . diazepam (VALIUM) 2 MG tablet Take 0.5-1 tablets (1-2 mg total) by mouth every 8 (eight) hours as needed for anxiety. 20 tablet 0  . ondansetron (ZOFRAN ODT) 4 MG disintegrating tablet Take 1 tablet (4 mg total) by mouth every 8 (eight) hours as needed for nausea. 20 tablet 0   No current facility-administered medications on file prior to visit.    ROS   Physical  Examination: There were no vitals taken for this visit. Ideal Body Weight:    Physical Exam   Assessment and Plan: Hailey [name] is a 34 y.o. female who is here today  1. Anxiety state *** - diazepam (VALIUM) 2 MG tablet; Take 1 tablet (2 mg total) by mouth every 8 (eight) hours as needed for anxiety.  Dispense: 30 tablet; Refill: 0  2. Other headache syndrome *** - diazepam (VALIUM) 2 MG tablet; Take 1 tablet (2 mg total) by mouth every 8 (eight) hours as needed for anxiety.  Dispense: 30 tablet; Refill: 0   Trena PlattStephanie English, PA-C [facility] 10/03/2015 3:23 PM

## 2015-10-27 ENCOUNTER — Telehealth: Payer: Self-pay

## 2015-10-27 DIAGNOSIS — F439 Reaction to severe stress, unspecified: Secondary | ICD-10-CM

## 2015-10-27 NOTE — Telephone Encounter (Signed)
Pt is request Rx refill  ALPRAZolam (Tab) XANAX 0.5 MG Take 1 tablet (0.5 mg total) by mouth at bedtime as needed for anxiety.       [phone]

## 2015-10-29 MED ORDER — ALPRAZOLAM 0.5 MG PO TABS
0.5000 mg | ORAL_TABLET | Freq: Every evening | ORAL | Status: DC | PRN
Start: 2015-10-29 — End: 2015-12-26

## 2015-10-29 NOTE — Telephone Encounter (Signed)
Meds ordered this encounter  Medications  . ALPRAZolam (XANAX) 0.5 MG tablet    Sig: Take 1 tablet (0.5 mg total) by mouth at bedtime as needed for anxiety.    Dispense:  30 tablet    Refill:  0    Order Specific Question:  Supervising Provider    Answer:  DOOLITTLE, ROBERT P [[id number]]    I presume that she is no longer taking the Valium?

## 2015-10-30 NOTE — Telephone Encounter (Signed)
Called pt who reported that she is not taking the valium. It was only Rxd for the migraines she has had twice now after miscarriages and related procedures. She does not normally have migraines and doesn't anticipate needing to take any valium. She is aware to not take them together. Faxed RF of alprazolam. Chelle, FYI

## 2015-11-06 ENCOUNTER — Telehealth: Payer: Self-pay

## 2015-11-06 DIAGNOSIS — F988 Other specified behavioral and emotional disorders with onset usually occurring in childhood and adolescence: Secondary | ICD-10-CM

## 2015-11-06 MED ORDER — AMPHETAMINE-DEXTROAMPHETAMINE 15 MG PO TABS
15.0000 mg | ORAL_TABLET | Freq: Two times a day (BID) | ORAL | Status: DC
Start: 2015-11-06 — End: 2015-12-13

## 2015-11-06 NOTE — Telephone Encounter (Signed)
Notified pt ready. 

## 2015-11-06 NOTE — Telephone Encounter (Signed)
Done

## 2015-11-06 NOTE — Telephone Encounter (Signed)
Pt is needing a refill on adderall   Best number [phone]

## 2015-12-09 ENCOUNTER — Telehealth: Payer: Self-pay

## 2015-12-09 DIAGNOSIS — F988 Other specified behavioral and emotional disorders with onset usually occurring in childhood and adolescence: Secondary | ICD-10-CM

## 2015-12-09 NOTE — Telephone Encounter (Signed)
Pt needs a refill on her adderall.  [phone]

## 2015-12-12 NOTE — Telephone Encounter (Signed)
Pt called again to follow up on her ADDERALL refill request. Pt has been out of this medication and is upset that it is taking this long to get a refill. She originally called 12/09/15 to request this.

## 2015-12-13 ENCOUNTER — Other Ambulatory Visit: Payer: Self-pay | Admitting: Physician Assistant

## 2015-12-13 DIAGNOSIS — F988 Other specified behavioral and emotional disorders with onset usually occurring in childhood and adolescence: Secondary | ICD-10-CM

## 2015-12-13 MED ORDER — AMPHETAMINE-DEXTROAMPHETAMINE 15 MG PO TABS: 15.0000 mg | ORAL_TABLET | Freq: Two times a day (BID) | ORAL | Status: DC

## 2015-12-13 NOTE — Progress Notes (Unsigned)
Spoke with patient and will refill for 1 month.  Advised to contact sarah for next refill.

## 2015-12-26 ENCOUNTER — Encounter (HOSPITAL_COMMUNITY): Payer: Self-pay | Admitting: *Deleted

## 2015-12-26 ENCOUNTER — Inpatient Hospital Stay (HOSPITAL_COMMUNITY): Payer: BLUE CROSS/BLUE SHIELD

## 2015-12-26 ENCOUNTER — Inpatient Hospital Stay (HOSPITAL_COMMUNITY)
Admission: AD | Admit: 2015-12-26 | Discharge: 2015-12-26 | Disposition: A | Discharge status: Home / Self Care | Payer: BLUE CROSS/BLUE SHIELD | Source: Ambulatory Visit | Attending: Obstetrics and Gynecology | Admitting: Obstetrics and Gynecology

## 2015-12-26 DIAGNOSIS — Z3A01 Less than 8 weeks gestation of pregnancy: Secondary | ICD-10-CM | POA: Diagnosis not present

## 2015-12-26 DIAGNOSIS — O209 Hemorrhage in early pregnancy, unspecified: Secondary | ICD-10-CM | POA: Insufficient documentation

## 2015-12-26 DIAGNOSIS — F988 Other specified behavioral and emotional disorders with onset usually occurring in childhood and adolescence: Secondary | ICD-10-CM | POA: Insufficient documentation

## 2015-12-26 DIAGNOSIS — E876 Hypokalemia: Secondary | ICD-10-CM | POA: Insufficient documentation

## 2015-12-26 DIAGNOSIS — R112 Nausea with vomiting, unspecified: Secondary | ICD-10-CM | POA: Insufficient documentation

## 2015-12-26 DIAGNOSIS — O468X1 Other antepartum hemorrhage, first trimester: Secondary | ICD-10-CM

## 2015-12-26 DIAGNOSIS — O218 Other vomiting complicating pregnancy: Secondary | ICD-10-CM | POA: Insufficient documentation

## 2015-12-26 DIAGNOSIS — O219 Vomiting of pregnancy, unspecified: Secondary | ICD-10-CM

## 2015-12-26 DIAGNOSIS — K5900 Constipation, unspecified: Secondary | ICD-10-CM | POA: Diagnosis not present

## 2015-12-26 DIAGNOSIS — N939 Abnormal uterine and vaginal bleeding, unspecified: Secondary | ICD-10-CM | POA: Diagnosis present

## 2015-12-26 DIAGNOSIS — O418X10 Other specified disorders of amniotic fluid and membranes, first trimester, not applicable or unspecified: Secondary | ICD-10-CM

## 2015-12-26 DIAGNOSIS — Z87891 Personal history of nicotine dependence: Secondary | ICD-10-CM | POA: Insufficient documentation

## 2015-12-26 LAB — CBC
HEMATOCRIT: 39.2 % (ref 36.0–46.0)
Hemoglobin: 14.1 g/dL (ref 12.0–15.0)
MCH: 31.9 pg (ref 26.0–34.0)
MCHC: 36 g/dL (ref 30.0–36.0)
MCV: 88.7 fL (ref 78.0–100.0)
PLATELETS: 276 10*3/uL (ref 150–400)
RBC: 4.42 MIL/uL (ref 3.87–5.11)
RDW: 12.4 % (ref 11.5–15.5)
WBC: 18.8 10*3/uL — ABNORMAL HIGH (ref 4.0–10.5)

## 2015-12-26 LAB — COMPREHENSIVE METABOLIC PANEL
ALK PHOS: 43 U/L (ref 38–126)
ALT: 17 U/L (ref 14–54)
AST: 28 U/L (ref 15–41)
Albumin: 4.9 g/dL (ref 3.5–5.0)
Anion gap: 12 (ref 5–15)
BILIRUBIN TOTAL: 1.5 mg/dL — AB (ref 0.3–1.2)
BUN: 12 mg/dL (ref 6–20)
CALCIUM: 9.2 mg/dL (ref 8.9–10.3)
CHLORIDE: 97 mmol/L — AB (ref 101–111)
CO2: 21 mmol/L — ABNORMAL LOW (ref 22–32)
CREATININE: 0.74 mg/dL (ref 0.44–1.00)
Glucose, Bld: 128 mg/dL — ABNORMAL HIGH (ref 65–99)
Potassium: 3.4 mmol/L — ABNORMAL LOW (ref 3.5–5.1)
Sodium: 130 mmol/L — ABNORMAL LOW (ref 135–145)
TOTAL PROTEIN: 7.9 g/dL (ref 6.5–8.1)

## 2015-12-26 MED ORDER — LACTATED RINGERS IV BOLUS (SEPSIS)
1000.0000 mL | Freq: Once | INTRAVENOUS | Status: AC
  Administered 2015-12-26: 1000 mL via INTRAVENOUS

## 2015-12-26 MED ORDER — POTASSIUM CHLORIDE 10 MEQ/100ML IV SOLN
10.0000 meq | INTRAVENOUS | Status: DC
  Filled 2015-12-26 (×2): qty 100

## 2015-12-26 MED ORDER — PROMETHAZINE HCL 25 MG/ML IJ SOLN
12.5000 mg | Freq: Once | INTRAMUSCULAR | Status: AC
  Administered 2015-12-26: 12.5 mg via INTRAVENOUS
  Filled 2015-12-26: qty 1

## 2015-12-26 MED ORDER — PROMETHAZINE HCL 25 MG RE SUPP: 25.0000 mg | Freq: Four times a day (QID) | RECTAL | Status: DC | PRN

## 2015-12-26 MED ORDER — M.V.I. ADULT IV INJ
INJECTION | Freq: Once | INTRAVENOUS | Status: AC
  Administered 2015-12-26: 11:00:00 via INTRAVENOUS
  Filled 2015-12-26: qty 10

## 2015-12-26 MED ORDER — FAMOTIDINE IN NACL 20-0.9 MG/50ML-% IV SOLN
20.0000 mg | Freq: Once | INTRAVENOUS | Status: AC
  Administered 2015-12-26: 20 mg via INTRAVENOUS
  Filled 2015-12-26: qty 50

## 2015-12-26 MED ORDER — POTASSIUM CHLORIDE ER 20 MEQ PO TBCR: 20.0000 meq | EXTENDED_RELEASE_TABLET | Freq: Every day | ORAL | Status: DC

## 2015-12-26 MED ORDER — RANITIDINE HCL 150 MG PO TABS: 150.0000 mg | ORAL_TABLET | Freq: Every day | ORAL | Status: DC

## 2015-12-26 NOTE — MAU Note (Signed)
Started with constipation last night strained and pushing to go to bathroom hx of IBS.  Then bleeding started after sitting on toilet for 2 hr.  Vomiting x 24 hrs

## 2015-12-26 NOTE — MAU Note (Signed)
Patient declines IV potassium. States she just wants to go home, that she will do better there in her home environment. Fabian NovemberM. Bhambri, CNM notified. Patient states she will try her best to take oral potassium. States again she feels she will do better at home and declines to stay for further IV intervention.

## 2015-12-26 NOTE — Discharge Instructions (Signed)
Hyperemesis Gravidarum Hyperemesis gravidarum is a severe form of nausea and vomiting that happens during pregnancy. Hyperemesis is worse than morning sickness. It may cause you to have nausea or vomiting all day for many days. It may keep you from eating and drinking enough food and liquids. Hyperemesis usually occurs during the first half (the first 20 weeks) of pregnancy. It often goes away once a woman is in her second half of pregnancy. However, sometimes hyperemesis continues through an entire pregnancy.  CAUSES  The cause of this condition is not completely known but is thought to be related to changes in the body's hormones when pregnant. It could be from the high level of the pregnancy hormone or an increase in estrogen in the body.  SIGNS AND SYMPTOMS   Severe nausea and vomiting.  Nausea that does not go away.  Vomiting that does not allow you to keep any food down.  Weight loss and body fluid loss (dehydration).  Having no desire to eat or not liking food you have previously enjoyed. DIAGNOSIS  Your health care provider will do a physical exam and ask you about your symptoms. He or she may also order blood tests and urine tests to make sure something else is not causing the problem.  TREATMENT  You may only need medicine to control the problem. If medicines do not control the nausea and vomiting, you will be treated in the hospital to prevent dehydration, increased acid in the blood (acidosis), weight loss, and changes in the electrolytes in your body that may harm the unborn baby (fetus). You may need IV fluids.  HOME CARE INSTRUCTIONS   Only take over-the-counter or prescription medicines as directed by your health care provider.  Try eating a couple of dry crackers or toast in the morning before getting out of bed.  Avoid foods and smells that upset your stomach.  Avoid fatty and spicy foods.  Eat 5-6 small meals a day.  Do not drink when eating meals. Drink between  meals.  For snacks, eat high-protein foods, such as cheese.  Eat or suck on things that have ginger in them. Ginger helps nausea.  Avoid food preparation. The smell of food can spoil your appetite.  Avoid iron pills and iron in your multivitamins until after 3-4 months of being pregnant. However, consult with your health care provider before stopping any prescribed iron pills. SEEK MEDICAL CARE IF:   Your abdominal pain increases.  You have a severe headache.  You have vision problems.  You are losing weight. SEEK IMMEDIATE MEDICAL CARE IF:   You are unable to keep fluids down.  You vomit blood.  You have constant nausea and vomiting.  You have excessive weakness.  You have extreme thirst.  You have dizziness or fainting.  You have a fever or persistent symptoms for more than 2-3 days.  You have a fever and your symptoms suddenly get worse. MAKE SURE YOU:   Understand these instructions.  Will watch your condition.  Will get help right away if you are not doing well or get worse.   This information is not intended to replace advice given to you by your health care provider. Make sure you discuss any questions you have with your health care provider.   Document Released: 10/10/2005 Document Revised: 07/31/2013 Document Reviewed: 05/22/2013 Elsevier Interactive Patient Education 2016 Elsevier Inc. Subchorionic Hematoma A subchorionic hematoma is a gathering of blood between the outer wall of the placenta and the inner wall of  the womb (uterus). The placenta is the organ that connects the fetus to the wall of the uterus. The placenta performs the feeding, breathing (oxygen to the fetus), and waste removal (excretory work) of the fetus.  °Subchorionic hematoma is the most common abnormality found on a result from ultrasonography done during the first trimester or early second trimester of pregnancy. If there has been little or no vaginal bleeding, early small hematomas  usually shrink on their own and do not affect your baby or pregnancy. The blood is gradually absorbed over 1-2 weeks. When bleeding starts later in pregnancy or the hematoma is larger or occurs in an older pregnant woman, the outcome may not be as good. Larger hematomas may get bigger, which increases the chances for miscarriage. Subchorionic hematoma also increases the risk of premature detachment of the placenta from the uterus, preterm (premature) labor, and stillbirth. °HOME CARE INSTRUCTIONS °· Stay on bed rest if your health care provider recommends this. Although bed rest will not prevent more bleeding or prevent a miscarriage, your health care provider may recommend bed rest until you are advised otherwise. °· Avoid heavy lifting (more than 10 lb [4.5 kg]), exercise, sexual intercourse, or douching as directed by your health care provider. °· Keep track of the number of pads you use each day and how soaked (saturated) they are. Write down this information. °· Do not use tampons. °· Keep all follow-up appointments as directed by your health care provider. Your health care provider may ask you to have follow-up blood tests or ultrasound tests or both. °SEEK IMMEDIATE MEDICAL CARE IF: °· You have severe cramps in your stomach, back, abdomen, or pelvis. °· You have a fever. °· You pass large clots or tissue. Save any tissue for your health care provider to look at. °· Your bleeding increases or you become lightheaded, feel weak, or have fainting episodes. °  °This information is not intended to replace advice given to you by your health care provider. Make sure you discuss any questions you have with your health care provider. °  °Document Released: 01/25/2007 Document Revised: 10/31/2014 Document Reviewed: 05/09/2013 °Elsevier Interactive Patient Education ©2016 Elsevier Inc. ° °

## 2015-12-26 NOTE — MAU Provider Note (Signed)
History     CSN: [id number]  Arrival date and time: 12/26/15 0830   Chief Complaint  Patient presents with  . Vaginal Bleeding  . Emesis During Pregnancy   HPI Comments: [id number] _0  by LMP c/o drk red VB after straining for BM this am. Reports episode of extreme constipation and 2 hrs of straining. No abdominal pain or cramping. Last IC 2 days ago. Also c/o N/V and dry heaving since having BM. Unable to tolerate po. Took Zofran w/o relief. Reports hx of using Zofran and Phenergan for N/V over last few weeks, Diclegis caused tachycardia and insomnia therefore she discontinued use. Pregnancy complicated by SAB x2, anxiety-not on meds, and VB around 5 wks-sono at that time revealed irregular shaped IUGS, no YS or FP.    Past Medical History  Diagnosis Date  . Anxiety     Panic attacks ~ 2005  . ADD (attention deficit disorder)     Past Surgical History  Procedure Laterality Date  . Hernia repair    . Cesarean section  06/2010    g2 p1 as of 08/2013.   Marland Kitchen Therapeutic abortion    . Dilation and evacuation N/A 03/09/2015    Procedure: DILATATION AND EVACUATION;  Surgeon: Brien Few, MD;  Location: [facility];  Service: Gynecology;  Laterality: N/A;    No family history on file.  Social History  Substance Use Topics  . Smoking status: Former Smoker    Types: Cigarettes    Quit date: 11/12/2008  . Smokeless tobacco: Never Used  . Alcohol Use: 1.2 oz/week    2 Standard drinks or equivalent per week    Allergies: No Known Allergies  Prescriptions prior to admission  Medication Sig Dispense Refill Last Dose  . ondansetron (ZOFRAN ODT) 4 MG disintegrating tablet Take 1 tablet (4 mg total) by mouth every 8 (eight) hours as needed for nausea. 20 tablet 0 12/26/2015 at Unknown time  . ALPRAZolam (XANAX) 0.5 MG tablet Take 1 tablet (0.5 mg total) by mouth at bedtime as needed for anxiety. (Patient not taking: Reported on 12/26/2015) 30 tablet 0 Not Taking at Unknown time  .  amphetamine-dextroamphetamine (ADDERALL) 15 MG tablet Take 1 tablet by mouth 2 (two) times daily. (Patient not taking: Reported on 12/26/2015) 60 tablet 0 Not Taking at Unknown time    Review of Systems  Constitutional: Negative.   HENT: Negative.   Eyes: Negative.   Respiratory: Negative.   Cardiovascular: Negative.   Gastrointestinal: Positive for nausea, vomiting and constipation.  Musculoskeletal: Negative.   Skin: Negative.   Neurological: Negative.   Endo/Heme/Allergies: Negative.   Psychiatric/Behavioral: Negative.    Physical Exam   Blood pressure 125/70, pulse 77, temperature 98 F (36.7 C), temperature source Oral, resp. rate 18, height _1  (1.676 m), weight 53.071 kg (117 lb), last menstrual period 06/02/2015.  Physical Exam  Constitutional: She is oriented to person, place, and time. She appears well-developed and well-nourished.  HENT:  Head: Normocephalic and atraumatic.  Neck: Normal range of motion. Neck supple.  Cardiovascular: Normal rate.   Respiratory: Effort normal.  GI: Soft. She exhibits no distension and no mass. There is no tenderness. There is no rebound and no guarding.  Genitourinary: Vagina normal and uterus normal.  Speculum: small amt drk red blood in vault, no active bleeding from os Bimanual: closed, long, gravid, non tender, no mass  Musculoskeletal: Normal range of motion.  Neurological: She is alert and oriented to person, place, and time.  Skin: Skin is  warm and dry.  Psychiatric:  Highly anxious   CLINICAL DATA: First trimester pregnancy with vaginal bleeding  EXAM: OBSTETRIC <14 WK [place] AND TRANSVAGINAL OB US  TECHNIQUE: Both transabdominal and transvaginal ultrasound examinations were performed for complete evaluation of the gestation as well as the maternal uterus, adnexal regions, and pelvic cul-de-sac. Transvaginal technique was performed to assess early pregnancy.  COMPARISON: None.  FINDINGS: Intrauterine gestational  sac: Visualized but somewhat irregular in contour  Yolk sac: Present  Embryo: Present  Cardiac Activity: Present  Heart Rate: 142 bpm  CRL: 10 mm 7 w 1 d [place] EDC: 08/12/2016  Subchorionic hemorrhage: Large subchorionic hemorrhage measuring 35 x 34 x 39 mm. A second anechoic oval structure measures 1.5 cm and is located in the fundus, with no internal soft tissue or irregularity. This is located well away from the larger subchorionic hemorrhage.  Maternal uterus/adnexae: Both ovaries are normal.  IMPRESSION: Mildly irregular gestational sac with live intrauterine gestation and large subchorionic hemorrhage. Second anechoic collection could represent another smaller prior hemorrhage or possibly an involuting second gestational sac (disappearing twin.)    Results for Hailey [name], Hailey [name] (MRN [id number]) as of 12/26/2015 11:44  Ref. Range 12/26/2015 09:15 12/26/2015 09:48  Sodium Latest Ref Range: 135-145 mmol/L 130 (L)   Potassium Latest Ref Range: 3.5-5.1 mmol/L 3.4 (L)   Chloride Latest Ref Range: 101-111 mmol/L 97 (L)   CO2 Latest Ref Range: 22-32 mmol/L 21 (L)   BUN Latest Ref Range: 6-20 mg/dL 12   Creatinine Latest Ref Range: 0.44-1.00 mg/dL 0.74   Calcium Latest Ref Range: 8.9-10.3 mg/dL 9.2   EGFR (Non-African Amer.) Latest Ref Range: >60 mL/min >60   EGFR (African American) Latest Ref Range: >60 mL/min >60   Glucose Latest Ref Range: 65-99 mg/dL 128 (H)   Anion gap Latest Ref Range: 5-15  12   Alkaline Phosphatase Latest Ref Range: 38-126 U/L 43   Albumin Latest Ref Range: 3.5-5.0 g/dL 4.9   AST Latest Ref Range: 15-41 U/L 28   ALT Latest Ref Range: 14-54 U/L 17   Total Protein Latest Ref Range: 6.5-8.1 g/dL 7.9   Total Bilirubin Latest Ref Range: 0.3-1.2 mg/dL 1.5 (H)   WBC Latest Ref Range: 4.0-10.5 K/uL 18.8 (H)   RBC Latest Ref Range: 3.87-5.11 MIL/uL 4.42   Hemoglobin Latest Ref Range: 12.0-15.0 g/dL 14.1   HCT Latest Ref  Range: 36.0-46.0 % 39.2   MCV Latest Ref Range: 78.0-100.0 fL 88.7   MCH Latest Ref Range: 26.0-34.0 pg 31.9   MCHC Latest Ref Range: 30.0-36.0 g/dL 36.0   RDW Latest Ref Range: 11.5-15.5 % 12.4   Platelets Latest Ref Range: 150-400 K/uL 276    MAU Course  Procedures  LR IVF bolus IV Pepcid IV Phenergan IV MTV in LR Refused IV K, wants to go home-agrees to po No vomiting episodes observed, dry heaves at times only Tolerating liquids/ice chips  Assessment and Plan  [id number] N/V of pregnancy VB in first trimester [facility] Rh positive (per office record)  Discharge home Phenergan 12.5-25 mg po q6 hrs prn (has Rx) Phenergan 25 mg pr q6 hrs prn #12, refill x1 Only use Zofran if no relief from Phenergan  Zantac 150 mg po daily #30, refill x1 K-dur 20 meq 1 po daily #14, no refill BRAT diet Maintain hydration and nutrition Bleeding/SAB precautions [facility] [place], N 12/26/2015, 10:19 AM

## 2015-12-26 NOTE — Progress Notes (Signed)
Fabian NovemberM Bhambri, CNM notified of patients arrival and complaint orders received,

## 2016-01-04 LAB — OB RESULTS CONSOLE RPR: RPR: NONREACTIVE

## 2016-01-04 LAB — OB RESULTS CONSOLE ANTIBODY SCREEN: ANTIBODY SCREEN: NEGATIVE

## 2016-01-04 LAB — OB RESULTS CONSOLE HIV ANTIBODY (ROUTINE TESTING): HIV: NONREACTIVE

## 2016-01-04 LAB — OB RESULTS CONSOLE RUBELLA ANTIBODY, IGM: RUBELLA: IMMUNE

## 2016-01-04 LAB — OB RESULTS CONSOLE ABO/RH: RH Type: POSITIVE

## 2016-01-04 LAB — OB RESULTS CONSOLE HEPATITIS B SURFACE ANTIGEN: Hepatitis B Surface Ag: NEGATIVE

## 2016-02-11 ENCOUNTER — Telehealth: Payer: Self-pay

## 2016-02-11 NOTE — Telephone Encounter (Signed)
Pt is needing a refill on adderall  Best number [phone]

## 2016-02-12 MED ORDER — AMPHETAMINE-DEXTROAMPHETAMINE 15 MG PO TABS: 15.0000 mg | ORAL_TABLET | Freq: Two times a day (BID) | ORAL | Status: DC

## 2016-02-12 NOTE — Telephone Encounter (Signed)
Notified pt on VM Rx ready. 

## 2016-02-13 NOTE — Telephone Encounter (Signed)
I spoke with patient.  She is pregnant and things are going well.  She was afraid to stop the Adderall cold [place] so she has been splitting in very small pieces.  She will take no more.  The Rx that I wrote was destroyed.

## 2016-02-13 NOTE — Telephone Encounter (Addendum)
Pt returned your call stating that she is currently pregnant and wants to talk with you about the adderall rx that is in the pick up drawer -should she come and get it and just reduce the dosage by cutting it in half and the in half again   Please call [phone]

## 2016-02-13 NOTE — Telephone Encounter (Signed)
According to the patient's chart she is pregnant - I suspect that she has had another miscarriage and LMOM to make sure she was not pregnant with the Adderall and to let me know if she needed anything.

## 2016-06-28 ENCOUNTER — Other Ambulatory Visit: Payer: Self-pay | Admitting: Obstetrics and Gynecology

## 2016-07-15 LAB — OB RESULTS CONSOLE GBS: STREP GROUP B AG: NEGATIVE

## 2016-08-02 ENCOUNTER — Encounter (HOSPITAL_COMMUNITY): Payer: Self-pay

## 2016-08-04 ENCOUNTER — Telehealth (HOSPITAL_COMMUNITY): Payer: Self-pay | Admitting: *Deleted

## 2016-08-04 NOTE — Telephone Encounter (Signed)
Preadmission screen  

## 2016-08-05 ENCOUNTER — Encounter (HOSPITAL_COMMUNITY): Payer: Self-pay

## 2016-08-08 ENCOUNTER — Encounter (HOSPITAL_COMMUNITY)
Admission: RE | Admit: 2016-08-08 | Discharge: 2016-08-08 | Disposition: A | Discharge status: Home / Self Care | Payer: BLUE CROSS/BLUE SHIELD | Source: Ambulatory Visit | Attending: Obstetrics and Gynecology | Admitting: Obstetrics and Gynecology

## 2016-08-08 HISTORY — DX: Gastro-esophageal reflux disease without esophagitis: K21.9

## 2016-08-08 HISTORY — DX: Personal history of other infectious and parasitic diseases: Z86.19

## 2016-08-08 HISTORY — DX: Headache: R51

## 2016-08-08 HISTORY — DX: Headache, unspecified: R51.9

## 2016-08-08 LAB — CBC
HEMATOCRIT: 34.5 % — AB (ref 36.0–46.0)
HEMOGLOBIN: 12 g/dL (ref 12.0–15.0)
MCH: 31.8 pg (ref 26.0–34.0)
MCHC: 34.8 g/dL (ref 30.0–36.0)
MCV: 91.5 fL (ref 78.0–100.0)
Platelets: 256 10*3/uL (ref 150–400)
RBC: 3.77 MIL/uL — ABNORMAL LOW (ref 3.87–5.11)
RDW: 13.8 % (ref 11.5–15.5)
WBC: 12.6 10*3/uL — ABNORMAL HIGH (ref 4.0–10.5)

## 2016-08-08 LAB — TYPE AND SCREEN
ABO/RH(D): O POS
Antibody Screen: NEGATIVE

## 2016-08-08 LAB — ABO/RH: ABO/RH(D): O POS

## 2016-08-08 NOTE — Patient Instructions (Signed)
[address] Clay Sinko  08/08/2016   Your procedure is scheduled on:  08/09/2016  Enter through the Main [facility]tr[facility] of [facility] at 0830 AM.  Pick up the phone at the desk and dial [phone].   Call this number if you have problems the morning of surgery: [phone]   Remember:   Do not eat food:After Midnight.  Do not drink clear liquids: After Midnight.  Take these medicines the morning of surgery with A SIP OF WATER: none   Do not wear jewelry, make-up or nail polish.  Do not wear lotions, powders, or perfumes. Do not wear deodorant.  Do not shave 48 hours prior to surgery.  Do not bring valuables to the hospital.  [facility] is not   responsible for any belongings or valuables brought to the hospital.  Contacts, dentures or bridgework may not be worn into surgery.  Leave suitcase in the car. After surgery it may be brought to your room.  For patients admitted to the hospital, checkout time is 11:00 AM the day of              discharge.   Patients discharged the day of surgery will not be allowed to drive             home.  Name and phone number of your driver: na  Special Instructions:   N/A   Please read over the following fact sheets that you were given:   Surgical Site Infection Prevention

## 2016-08-09 ENCOUNTER — Inpatient Hospital Stay (HOSPITAL_COMMUNITY): Payer: BLUE CROSS/BLUE SHIELD | Admitting: Anesthesiology

## 2016-08-09 ENCOUNTER — Inpatient Hospital Stay (HOSPITAL_COMMUNITY)
Admission: RE | Admit: 2016-08-09 | Discharge: 2016-08-12 | DRG: 766 | Disposition: A | Discharge status: Home / Self Care | Payer: BLUE CROSS/BLUE SHIELD | Source: Ambulatory Visit | Attending: Obstetrics and Gynecology | Admitting: Obstetrics and Gynecology

## 2016-08-09 ENCOUNTER — Encounter (HOSPITAL_COMMUNITY): Payer: Self-pay

## 2016-08-09 ENCOUNTER — Encounter (HOSPITAL_COMMUNITY)
Admission: RE | Disposition: A | Discharge status: Home / Self Care | Payer: Self-pay | Source: Ambulatory Visit | Attending: Obstetrics and Gynecology

## 2016-08-09 DIAGNOSIS — Z3A39 39 weeks gestation of pregnancy: Secondary | ICD-10-CM | POA: Diagnosis not present

## 2016-08-09 DIAGNOSIS — O9962 Diseases of the digestive system complicating childbirth: Secondary | ICD-10-CM | POA: Diagnosis present

## 2016-08-09 DIAGNOSIS — O34211 Maternal care for low transverse scar from previous cesarean delivery: Principal | ICD-10-CM | POA: Diagnosis present

## 2016-08-09 DIAGNOSIS — K219 Gastro-esophageal reflux disease without esophagitis: Secondary | ICD-10-CM | POA: Diagnosis present

## 2016-08-09 DIAGNOSIS — O34219 Maternal care for unspecified type scar from previous cesarean delivery: Secondary | ICD-10-CM | POA: Diagnosis present

## 2016-08-09 DIAGNOSIS — Z87891 Personal history of nicotine dependence: Secondary | ICD-10-CM | POA: Diagnosis not present

## 2016-08-09 LAB — RPR: RPR Ser Ql: NONREACTIVE

## 2016-08-09 SURGERY — Surgical Case
Anesthesia: Spinal | Site: Abdomen

## 2016-08-09 MED ORDER — FENTANYL CITRATE (PF) 100 MCG/2ML IJ SOLN: 25.0000 ug | INTRAMUSCULAR | Status: DC | PRN

## 2016-08-09 MED ORDER — LACTATED RINGERS IV SOLN
INTRAVENOUS | Status: DC
  Administered 2016-08-09 (×2): via INTRAVENOUS

## 2016-08-09 MED ORDER — NALBUPHINE HCL 10 MG/ML IJ SOLN: 5.0000 mg | INTRAMUSCULAR | Status: DC | PRN

## 2016-08-09 MED ORDER — MEPERIDINE HCL 25 MG/ML IJ SOLN: 6.2500 mg | INTRAMUSCULAR | Status: DC | PRN

## 2016-08-09 MED ORDER — OXYCODONE-ACETAMINOPHEN 5-325 MG PO TABS
2.0000 | ORAL_TABLET | ORAL | Status: DC | PRN
  Administered 2016-08-10 – 2016-08-11 (×6): 2 via ORAL
  Filled 2016-08-09 (×6): qty 2

## 2016-08-09 MED ORDER — NALBUPHINE HCL 10 MG/ML IJ SOLN: 5.0000 mg | Freq: Once | INTRAMUSCULAR | Status: DC | PRN

## 2016-08-09 MED ORDER — DIBUCAINE 1 % RE OINT: 1.0000 "application " | TOPICAL_OINTMENT | RECTAL | Status: DC | PRN

## 2016-08-09 MED ORDER — DIPHENHYDRAMINE HCL 25 MG PO CAPS
25.0000 mg | ORAL_CAPSULE | ORAL | Status: DC | PRN
  Filled 2016-08-09: qty 1

## 2016-08-09 MED ORDER — MENTHOL 3 MG MT LOZG
1.0000 | LOZENGE | OROMUCOSAL | Status: DC | PRN
  Filled 2016-08-09: qty 9

## 2016-08-09 MED ORDER — SIMETHICONE 80 MG PO CHEW
80.0000 mg | CHEWABLE_TABLET | ORAL | Status: DC | PRN
  Filled 2016-08-09: qty 1

## 2016-08-09 MED ORDER — KETOROLAC TROMETHAMINE 30 MG/ML IJ SOLN
INTRAMUSCULAR | Status: AC
  Administered 2016-08-09: 30 mg via INTRAVENOUS
  Filled 2016-08-09: qty 1

## 2016-08-09 MED ORDER — METHYLERGONOVINE MALEATE 0.2 MG PO TABS: 0.2000 mg | ORAL_TABLET | ORAL | Status: DC | PRN

## 2016-08-09 MED ORDER — SIMETHICONE 80 MG PO CHEW
80.0000 mg | CHEWABLE_TABLET | Freq: Three times a day (TID) | ORAL | Status: DC
  Administered 2016-08-09 – 2016-08-12 (×7): 80 mg via ORAL
  Filled 2016-08-09 (×14): qty 1

## 2016-08-09 MED ORDER — KETOROLAC TROMETHAMINE 30 MG/ML IJ SOLN: 30.0000 mg | Freq: Four times a day (QID) | INTRAMUSCULAR | Status: DC | PRN

## 2016-08-09 MED ORDER — LACTATED RINGERS IV SOLN
INTRAVENOUS | Status: DC
  Administered 2016-08-09: 13:00:00 via INTRAVENOUS

## 2016-08-09 MED ORDER — SODIUM CHLORIDE 0.9% FLUSH: 3.0000 mL | INTRAVENOUS | Status: DC | PRN

## 2016-08-09 MED ORDER — TETANUS-DIPHTH-ACELL PERTUSSIS 5-2.5-18.5 LF-MCG/0.5 IM SUSP: 0.5000 mL | Freq: Once | INTRAMUSCULAR | Status: DC

## 2016-08-09 MED ORDER — KETOROLAC TROMETHAMINE 30 MG/ML IJ SOLN
30.0000 mg | Freq: Four times a day (QID) | INTRAMUSCULAR | Status: DC | PRN
  Administered 2016-08-09: 30 mg via INTRAVENOUS
  Filled 2016-08-09: qty 1

## 2016-08-09 MED ORDER — METHYLERGONOVINE MALEATE 0.2 MG/ML IJ SOLN: 0.2000 mg | INTRAMUSCULAR | Status: DC | PRN

## 2016-08-09 MED ORDER — ONDANSETRON HCL 4 MG/2ML IJ SOLN: 4.0000 mg | Freq: Three times a day (TID) | INTRAMUSCULAR | Status: DC | PRN

## 2016-08-09 MED ORDER — LACTATED RINGERS IV SOLN: INTRAVENOUS | Status: DC

## 2016-08-09 MED ORDER — NALOXONE HCL 2 MG/2ML IJ SOSY
1.0000 ug/kg/h | PREFILLED_SYRINGE | INTRAVENOUS | Status: DC | PRN
  Filled 2016-08-09: qty 2

## 2016-08-09 MED ORDER — ZOLPIDEM TARTRATE 5 MG PO TABS: 5.0000 mg | ORAL_TABLET | Freq: Every evening | ORAL | Status: DC | PRN

## 2016-08-09 MED ORDER — COCONUT OIL OIL
1.0000 "application " | TOPICAL_OIL | Status: DC | PRN
  Administered 2016-08-10: 1 via TOPICAL
  Filled 2016-08-09: qty 120

## 2016-08-09 MED ORDER — SCOPOLAMINE 1 MG/3DAYS TD PT72
MEDICATED_PATCH | TRANSDERMAL | Status: AC
  Administered 2016-08-09: 1.5 mg via TRANSDERMAL
  Filled 2016-08-09: qty 1

## 2016-08-09 MED ORDER — MIDAZOLAM HCL 2 MG/2ML IJ SOLN
INTRAMUSCULAR | Status: DC | PRN
  Administered 2016-08-09: 0.5 mg via INTRAVENOUS
  Administered 2016-08-09: 1 mg via INTRAVENOUS

## 2016-08-09 MED ORDER — DIPHENHYDRAMINE HCL 50 MG/ML IJ SOLN: 12.5000 mg | INTRAMUSCULAR | Status: DC | PRN

## 2016-08-09 MED ORDER — PRENATAL MULTIVITAMIN CH
1.0000 | ORAL_TABLET | Freq: Every day | ORAL | Status: DC
  Filled 2016-08-09 (×4): qty 1

## 2016-08-09 MED ORDER — SIMETHICONE 80 MG PO CHEW
80.0000 mg | CHEWABLE_TABLET | ORAL | Status: DC
  Administered 2016-08-09 – 2016-08-11 (×3): 80 mg via ORAL
  Filled 2016-08-09 (×5): qty 1

## 2016-08-09 MED ORDER — SCOPOLAMINE 1 MG/3DAYS TD PT72
1.0000 | MEDICATED_PATCH | Freq: Once | TRANSDERMAL | Status: DC
  Administered 2016-08-09: 1.5 mg via TRANSDERMAL

## 2016-08-09 MED ORDER — CEFAZOLIN SODIUM-DEXTROSE 2-4 GM/100ML-% IV SOLN
2.0000 g | INTRAVENOUS | Status: AC
  Administered 2016-08-09: 2 g via INTRAVENOUS

## 2016-08-09 MED ORDER — BUPIVACAINE HCL (PF) 0.25 % IJ SOLN
INTRAMUSCULAR | Status: AC
  Filled 2016-08-09: qty 30

## 2016-08-09 MED ORDER — NALOXONE HCL 0.4 MG/ML IJ SOLN: 0.4000 mg | INTRAMUSCULAR | Status: DC | PRN

## 2016-08-09 MED ORDER — FENTANYL CITRATE (PF) 100 MCG/2ML IJ SOLN
INTRAMUSCULAR | Status: DC | PRN
  Administered 2016-08-09: 12.5 ug via INTRATHECAL

## 2016-08-09 MED ORDER — BUPIVACAINE HCL (PF) 0.25 % IJ SOLN
INTRAMUSCULAR | Status: DC | PRN
  Administered 2016-08-09: 20 mL

## 2016-08-09 MED ORDER — METOCLOPRAMIDE HCL 5 MG/ML IJ SOLN: 10.0000 mg | Freq: Once | INTRAMUSCULAR | Status: DC | PRN

## 2016-08-09 MED ORDER — SODIUM CHLORIDE 0.9 % IR SOLN
Status: DC | PRN
  Administered 2016-08-09: 1000 mL

## 2016-08-09 MED ORDER — FENTANYL CITRATE (PF) 100 MCG/2ML IJ SOLN
INTRAMUSCULAR | Status: AC
  Filled 2016-08-09: qty 2

## 2016-08-09 MED ORDER — ACETAMINOPHEN 325 MG PO TABS
650.0000 mg | ORAL_TABLET | ORAL | Status: DC | PRN
Start: 2016-08-09 — End: 2016-08-12
  Administered 2016-08-09: 650 mg via ORAL
  Filled 2016-08-09: qty 2

## 2016-08-09 MED ORDER — KETOROLAC TROMETHAMINE 30 MG/ML IJ SOLN
30.0000 mg | Freq: Four times a day (QID) | INTRAMUSCULAR | Status: DC | PRN
  Administered 2016-08-09: 30 mg via INTRAMUSCULAR

## 2016-08-09 MED ORDER — SCOPOLAMINE 1 MG/3DAYS TD PT72: 1.0000 | MEDICATED_PATCH | Freq: Once | TRANSDERMAL | Status: DC

## 2016-08-09 MED ORDER — PHENYLEPHRINE 8 MG IN D5W 100 ML (0.08MG/ML) PREMIX OPTIME
INJECTION | INTRAVENOUS | Status: DC | PRN
  Administered 2016-08-09: 60 ug/min via INTRAVENOUS

## 2016-08-09 MED ORDER — SCOPOLAMINE 1 MG/3DAYS TD PT72
1.0000 | MEDICATED_PATCH | Freq: Once | TRANSDERMAL | Status: DC
Start: 2016-08-09 — End: 2016-08-09

## 2016-08-09 MED ORDER — LACTATED RINGERS IV SOLN
Freq: Once | INTRAVENOUS | Status: AC
  Administered 2016-08-09: 09:00:00 via INTRAVENOUS

## 2016-08-09 MED ORDER — OXYCODONE-ACETAMINOPHEN 5-325 MG PO TABS
1.0000 | ORAL_TABLET | ORAL | Status: DC | PRN
  Administered 2016-08-12: 1 via ORAL
  Filled 2016-08-09 (×2): qty 1

## 2016-08-09 MED ORDER — WITCH HAZEL-GLYCERIN EX PADS: 1.0000 "application " | MEDICATED_PAD | CUTANEOUS | Status: DC | PRN

## 2016-08-09 MED ORDER — OXYTOCIN 40 UNITS IN LACTATED RINGERS INFUSION - SIMPLE MED: 2.5000 [IU]/h | INTRAVENOUS | Status: AC

## 2016-08-09 MED ORDER — MORPHINE SULFATE-NACL 0.5-0.9 MG/ML-% IV SOSY
PREFILLED_SYRINGE | INTRAVENOUS | Status: AC
  Filled 2016-08-09: qty 1

## 2016-08-09 MED ORDER — DIPHENHYDRAMINE HCL 25 MG PO CAPS
25.0000 mg | ORAL_CAPSULE | Freq: Four times a day (QID) | ORAL | Status: DC | PRN
  Filled 2016-08-09: qty 1

## 2016-08-09 MED ORDER — BUPIVACAINE IN DEXTROSE 0.75-8.25 % IT SOLN
INTRATHECAL | Status: DC | PRN
  Administered 2016-08-09: 1.4 mL via INTRATHECAL

## 2016-08-09 MED ORDER — OXYTOCIN 10 UNIT/ML IJ SOLN
INTRAMUSCULAR | Status: AC
  Filled 2016-08-09: qty 4

## 2016-08-09 MED ORDER — SODIUM CHLORIDE 0.9 % IJ SOLN
INTRAMUSCULAR | Status: AC
  Filled 2016-08-09: qty 20

## 2016-08-09 MED ORDER — SENNOSIDES-DOCUSATE SODIUM 8.6-50 MG PO TABS
2.0000 | ORAL_TABLET | ORAL | Status: DC
  Administered 2016-08-09 – 2016-08-11 (×3): 2 via ORAL
  Filled 2016-08-09 (×5): qty 2

## 2016-08-09 MED ORDER — OXYTOCIN 10 UNIT/ML IJ SOLN
INTRAMUSCULAR | Status: DC | PRN
  Administered 2016-08-09: 40 [IU] via INTRAMUSCULAR

## 2016-08-09 MED ORDER — ONDANSETRON HCL 4 MG/2ML IJ SOLN
INTRAMUSCULAR | Status: AC
  Filled 2016-08-09: qty 2

## 2016-08-09 MED ORDER — ONDANSETRON HCL 4 MG/2ML IJ SOLN
INTRAMUSCULAR | Status: DC | PRN
  Administered 2016-08-09: 4 mg via INTRAVENOUS

## 2016-08-09 MED ORDER — IBUPROFEN 600 MG PO TABS
600.0000 mg | ORAL_TABLET | Freq: Four times a day (QID) | ORAL | Status: DC
  Administered 2016-08-09 – 2016-08-12 (×11): 600 mg via ORAL
  Filled 2016-08-09 (×11): qty 1

## 2016-08-09 MED ORDER — MIDAZOLAM HCL 2 MG/2ML IJ SOLN
INTRAMUSCULAR | Status: AC
  Filled 2016-08-09: qty 2

## 2016-08-09 MED ORDER — MORPHINE SULFATE (PF) 0.5 MG/ML IJ SOLN
INTRAMUSCULAR | Status: DC | PRN
  Administered 2016-08-09: .2 mg via INTRATHECAL

## 2016-08-09 SURGICAL SUPPLY — 28 items
BENZOIN TINCTURE PRP APPL 2/3 (GAUZE/BANDAGES/DRESSINGS) ×3 IMPLANT
CHLORAPREP W/TINT 26ML (MISCELLANEOUS) ×3 IMPLANT
CLAMP CORD UMBIL (MISCELLANEOUS) ×3 IMPLANT
CLOSURE WOUND 1/2 X4 (GAUZE/BANDAGES/DRESSINGS) ×1
CLOTH BEACON ORANGE TIMEOUT ST (SAFETY) ×3 IMPLANT
DRSG OPSITE POSTOP 4X10 (GAUZE/BANDAGES/DRESSINGS) ×3 IMPLANT
ELECT REM PT RETURN 9FT ADLT (ELECTROSURGICAL) ×3
ELECTRODE REM PT RTRN 9FT ADLT (ELECTROSURGICAL) ×1 IMPLANT
GLOVE BIO SURGEON STRL SZ7.5 (GLOVE) ×3 IMPLANT
GLOVE BIOGEL PI IND STRL 7.0 (GLOVE) ×1 IMPLANT
GLOVE BIOGEL PI INDICATOR 7.0 (GLOVE) ×2
GOWN STRL REUS W/TWL LRG LVL3 (GOWN DISPOSABLE) ×6 IMPLANT
NEEDLE HYPO 22GX1.5 SAFETY (NEEDLE) ×3 IMPLANT
NS IRRIG 1000ML POUR BTL (IV SOLUTION) ×3 IMPLANT
PACK C SECTION WH (CUSTOM PROCEDURE TRAY) ×3 IMPLANT
PENCIL SMOKE EVAC W/HOLSTER (ELECTROSURGICAL) ×3 IMPLANT
STRIP CLOSURE SKIN 1/2X4 (GAUZE/BANDAGES/DRESSINGS) ×2 IMPLANT
SUT MNCRL 0 VIOLET CTX 36 (SUTURE) ×3 IMPLANT
SUT MNCRL AB 3-0 PS2 27 (SUTURE) ×3 IMPLANT
SUT MON AB 2-0 CT1 27 (SUTURE) ×3 IMPLANT
SUT MON AB-0 CT1 36 (SUTURE) ×6 IMPLANT
SUT MONOCRYL 0 CTX 36 (SUTURE) ×6
SUT PLAIN 2 0 (SUTURE) ×2
SUT PLAIN ABS 2-0 CT1 27XMFL (SUTURE) ×1 IMPLANT
SYR 20CC LL (SYRINGE) ×3 IMPLANT
SYR CONTROL 10ML LL (SYRINGE) ×3 IMPLANT
TOWEL OR 17X24 6PK STRL BLUE (TOWEL DISPOSABLE) ×3 IMPLANT
TRAY FOLEY CATH SILVER 14FR (SET/KITS/TRAYS/PACK) ×3 IMPLANT

## 2016-08-09 NOTE — Anesthesia Preprocedure Evaluation (Signed)
Anesthesia Evaluation  Patient identified by MRN, date of birth, ID band Patient awake    Reviewed: Allergy & Precautions, NPO status , Patient's Chart, lab work & pertinent test results  Airway Mallampati: II  TM Distance: >3 FB Neck ROM: Full    Dental no notable dental hx.    Pulmonary neg pulmonary ROS, former smoker,    Pulmonary exam normal breath sounds clear to auscultation       Cardiovascular negative cardio ROS Normal cardiovascular exam Rhythm:Regular Rate:Normal     Neuro/Psych negative neurological ROS  negative psych ROS   GI/Hepatic negative GI ROS, Neg liver ROS,   Endo/Other  negative endocrine ROS  Renal/GU negative Renal ROS  negative genitourinary   Musculoskeletal negative musculoskeletal ROS (+)   Abdominal   Peds negative pediatric ROS (+)  Hematology negative hematology ROS (+)   Anesthesia Other Findings   Reproductive/Obstetrics (+) Pregnancy                             Anesthesia Physical Anesthesia Plan  ASA: II  Anesthesia Plan: Spinal   Post-op Pain Management:    Induction: Intravenous  Airway Management Planned: Natural Airway  Additional Equipment:   Intra-op Plan:   Post-operative Plan:   Informed Consent: I have reviewed the patients History and Physical, chart, labs and discussed the procedure including the risks, benefits and alternatives for the proposed anesthesia with the patient or authorized representative who has indicated his/her understanding and acceptance.   Dental advisory given  Plan Discussed with: CRNA  Anesthesia Plan Comments:         Anesthesia Quick Evaluation

## 2016-08-09 NOTE — Op Note (Signed)
Cesarean Section Procedure Note  Indications: previous uterine incision kerr x one  Pre-operative Diagnosis: 39 week 2 day pregnancy.  Post-operative Diagnosis: same  Surgeon: Lenoard AdenAAVON,Savaughn Karwowski J   Assistants: Arita Missawson, CNM  Anesthesia: Local anesthesia 0.25.% bupivacaine and Spinal anesthesia  ASA Class: 2  Procedure Details  The patient was seen in the Holding Room. The risks, benefits, complications, treatment options, and expected outcomes were discussed with the patient.  The patient concurred with the proposed plan, giving informed consent. The risks of anesthesia, infection, bleeding and possible injury to other organs discussed. Injury to bowel, bladder, or ureter with possible need for repair discussed. Possible need for transfusion with secondary risks of hepatitis or HIV acquisition discussed. Post operative complications to include but not limited to DVT, PE and Pneumonia noted. The site of surgery properly noted/marked. The patient was taken to Operating Room # 9, identified as Hailey [name] and the procedure verified as C-Section Delivery. A Time Out was held and the above information confirmed.  After induction of anesthesia, the patient was draped and prepped in the usual sterile manner. A Pfannenstiel incision was made and carried down through the subcutaneous tissue to the fascia. Fascial incision was made and extended transversely using Mayo scissors. The fascia was separated from the underlying rectus tissue superiorly and inferiorly. The peritoneum was identified and entered. Peritoneal incision was extended longitudinally. The utero-vesical peritoneal reflection was incised transversely and the bladder flap was bluntly freed from the lower uterine segment. A low transverse uterine incision(Kerr hysterotomy) was made. Delivered from OA presentation was a  female with Apgar scores of 8 at one minute and 9 at five minutes. Bulb suctioning gently performed. Neonatal team in  attendance.After the umbilical cord was clamped and cut cord blood was obtained for evaluation. The placenta was removed intact and appeared normal. The uterus was curetted with a dry lap pack. Good hemostasis was noted.The uterine outline, tubes and ovaries appeared normal. The uterine incision was closed with running locked sutures of 0 Monocryl x 2 layers. Hemostasis was observed. Lavage was carried out until clear.The parietal peritoneum was closed with a running 2-0 Monocryl suture. The fascia was then reapproximated with running sutures of 0 Monocryl. The skin was reapproximated with 3-0 monocryl after [place] closure with 2-0 plain.  Instrument, sponge, and needle counts were correct prior the abdominal closure and at the conclusion of the case.   Findings: FTLF, anterior placenta  Estimated Blood Loss:  500         Drains: foley                 Specimens: placenta                 Complications:  None; patient tolerated the procedure well.         Disposition: PACU - hemodynamically stable.         Condition: stable  Attending Attestation: I performed the procedure.

## 2016-08-09 NOTE — H&P (Signed)
Hailey [name] is a 35 y.o. female presenting for repeat csection. Declines VBAC. OB History    Gravida Para Term Preterm AB Living   5 1 1   3 1    SAB TAB Ectopic Multiple Live Births   2 1     1      Past Medical History:  Diagnosis Date  . ADD (attention deficit disorder)   . Anxiety    Panic attacks ~ 2005  . GERD (gastroesophageal reflux disease)   . Headache   . Hx of varicella    Past Surgical History:  Procedure Laterality Date  . CESAREAN SECTION  06/2010   g2 p1 as of 08/2013.   Marland Kitchen. DILATION AND EVACUATION N/A 03/09/2015   Procedure: DILATATION AND EVACUATION;  Surgeon: Olivia Mackieichard Brecklynn Jian, MD;  Location: [facility];  Service: Gynecology;  Laterality: N/A;  . HERNIA REPAIR    . THERAPEUTIC ABORTION     Family History: family history includes Multiple sclerosis in her father. Social History:  reports that she quit smoking about 7 years ago. Her smoking use included Cigarettes. She has never used smokeless tobacco. She reports that she drinks about 1.2 oz of alcohol per week . She reports that she does not use drugs.     Maternal Diabetes: No Genetic Screening: Normal Maternal Ultrasounds/Referrals: Normal Fetal Ultrasounds or other Referrals:  None Maternal Substance Abuse:  No Significant Maternal Medications:  None Significant Maternal Lab Results:  None Other Comments:  None  Review of Systems  Constitutional: Negative.   All other systems reviewed and are negative.  Maternal Medical History:  Fetal activity: Perceived fetal activity is normal.   Last perceived fetal movement was within the past hour.    Prenatal complications: Bleeding.   Prenatal Complications - Diabetes: none.      Blood pressure 113/83, pulse 87, temperature 98.1 F (36.7 C), temperature source Oral, resp. rate 16, last menstrual period 06/02/2015, SpO2 99 %. Maternal Exam:  Uterine Assessment: Contraction strength is mild.  Contraction frequency is irregular and rare.   Abdomen:  Patient reports no abdominal tenderness. Surgical scars: low transverse.   Fetal presentation: vertex  Introitus: Normal vulva. Normal vagina.  Ferning test: not done.  Nitrazine test: not done. Amniotic fluid character: not assessed.  Pelvis: questionable for delivery.   Cervix: Cervix evaluated by digital exam.     Physical Exam  Nursing note and vitals reviewed. Constitutional: She is oriented to person, place, and time. She appears well-developed and well-nourished.  HENT:  Head: Normocephalic and atraumatic.  Neck: Normal range of motion. Neck supple.  Cardiovascular: Normal rate and regular rhythm.   Respiratory: Effort normal and breath sounds normal.  GI: Bowel sounds are normal.  Genitourinary: Vagina normal and uterus normal. Rectal exam shows guaiac negative stool.  Musculoskeletal: Normal range of motion.  Neurological: She is alert and oriented to person, place, and time. She has normal reflexes.  Skin: Skin is warm and dry.  Psychiatric: She has a normal mood and affect.    Prenatal labs: ABO, Rh: --/--/O POS, O POS (10/16 1150) Antibody: NEG (10/16 1150) Rubella: Immune (03/13 0000) RPR: Non Reactive (10/16 1150)  HBsAg: Negative (03/13 0000)  HIV: Non-reactive (03/13 0000)  GBS: Negative (09/22 0000)   Assessment/Plan: 39 week IUP Previous csection for breech Rpt csection. Consent done.    Hailey [name] J 08/09/2016, 9:36 AM

## 2016-08-09 NOTE — Transfer of Care (Signed)
Immediate Anesthesia Transfer of Care Note  Patient: Hailey [name]  Procedure(s) Performed: Procedure(s) with comments: Repeat CESAREAN SECTION (N/A) - EDD: 08/15/16  Patient Location: PACU  Anesthesia Type:Spinal  Level of Consciousness: sedated  Airway & Oxygen Therapy: Patient Spontanous Breathing  Post-op Assessment: Report given to RN  Post vital signs: Reviewed and stable  Last Vitals:  Vitals:   08/09/16 0855  BP: 113/83  Pulse: 87  Resp: 16  Temp: 36.7 C    Last Pain:  Vitals:   08/09/16 0855  TempSrc: Oral      Patients Stated Pain Goal: 3 (08/09/16 0855)  Complications: No apparent anesthesia complications

## 2016-08-09 NOTE — Anesthesia Postprocedure Evaluation (Signed)
Anesthesia Post Note  Patient: Hailey [name]  Procedure(s) Performed: Procedure(s) (LRB): Repeat CESAREAN SECTION (N/A)  Patient location during evaluation: Mother Baby Anesthesia Type: Spinal Level of consciousness: awake and alert and oriented Pain management: satisfactory to patient Vital Signs Assessment: post-procedure vital signs reviewed and stable Respiratory status: respiratory function stable and spontaneous breathing Cardiovascular status: blood pressure returned to baseline Postop Assessment: no headache, no backache, spinal receding, patient able to bend at knees and adequate PO intake Anesthetic complications: no     Last Vitals:  Vitals:   08/09/16 1530 08/09/16 1845  BP: 112/60   Pulse: 62   Resp: 16 18  Temp: 36.7 C 36.7 C    Last Pain:  Vitals:   08/09/16 1845  TempSrc: Oral  PainSc:    Pain Goal: Patients Stated Pain Goal: 3 (08/09/16 0855)               Karleen DolphinFUSSELL,Makinley Muscato

## 2016-08-09 NOTE — Lactation Note (Signed)
This note was copied from a baby's chart. Lactation Consultation Note  Patient Name: Hailey [name] Reason for consult: Initial assessment Baby at 6 hr of life. Mom has wide, short nipples with compressible breast. The nipple surface looks a little purple but mom claims that is normal. Discussed baby behavior, feeding frequency, baby belly size, voids, wt loss, breast changes, and nipple care. Demonstrated manual expression, colostrum noted bilaterally, spoon in room. Given lactation handouts. Aware of OP services and support group.     Maternal Data Has patient been taught Hand Expression?: Yes  Feeding Feeding Type: Breast Fed Length of feed: 0 min  LATCH Score/Interventions Latch: Grasps breast easily, tongue down, lips flanged, rhythmical sucking. Intervention(s): Adjust position;Breast compression  Audible Swallowing: A few with stimulation Intervention(s): Hand expression;Skin to skin Intervention(s): Alternate breast massage  Type of Nipple: Everted at rest and after stimulation  Comfort (Breast/Nipple): Soft / non-tender     Hold (Positioning): Assistance needed to correctly position infant at breast and maintain latch. Intervention(s): Support Pillows;Position options  LATCH Score: 8  Lactation Tools Discussed/Used WIC Program: No   Consult Status Consult Status: Follow-up Date: 08/10/16 Follow-up type: In-patient    [name], 5:32 PM

## 2016-08-09 NOTE — Anesthesia Procedure Notes (Signed)
Spinal  Patient location during procedure: OR Staffing Anesthesiologist: Valda Christenson Performed: anesthesiologist  Preanesthetic Checklist Completed: patient identified, site marked, surgical consent, pre-op evaluation, timeout performed, IV checked, risks and benefits discussed and monitors and equipment checked Spinal Block Patient position: sitting Prep: ChloraPrep Patient monitoring: heart rate, continuous pulse ox and blood pressure Approach: midline Location: L4-5 Injection technique: single-shot Needle Needle type: Sprotte  Needle gauge: 24 G Needle length: 9 cm Additional Notes Expiration date of kit checked and confirmed. Patient tolerated procedure well, without complications.       

## 2016-08-10 LAB — CBC
HCT: 26.3 % — ABNORMAL LOW (ref 36.0–46.0)
Hemoglobin: 9.2 g/dL — ABNORMAL LOW (ref 12.0–15.0)
MCH: 31.8 pg (ref 26.0–34.0)
MCHC: 35 g/dL (ref 30.0–36.0)
MCV: 91 fL (ref 78.0–100.0)
PLATELETS: 217 10*3/uL (ref 150–400)
RBC: 2.89 MIL/uL — ABNORMAL LOW (ref 3.87–5.11)
RDW: 14 % (ref 11.5–15.5)
WBC: 13.7 10*3/uL — ABNORMAL HIGH (ref 4.0–10.5)

## 2016-08-10 LAB — BIRTH TISSUE RECOVERY COLLECTION (PLACENTA DONATION)

## 2016-08-10 MED ORDER — INFLUENZA VAC SPLIT QUAD 0.5 ML IM SUSY: 0.5000 mL | PREFILLED_SYRINGE | INTRAMUSCULAR | Status: DC

## 2016-08-10 NOTE — Lactation Note (Addendum)
This note was copied from a baby's chart. Lactation Consultation Note   This mom and baby were seen by and note written by  Jaionna Weisse Mikal PlaneLee, Rn IBCLC, not Olene FlossElizabeth [name]  Patient Name: Hailey [name] ZOXWR'UToday's Date: 08/10/2016 Reason for consult: Follow-up assessment  With this mom of a term baby, now 7128 hours old, and having trouble latching. Baby may have tight posterior frenulum. Baby sleepy at this time, so I hand expressed 5 ml's of colostrum into a spoon, and showed mom how to spoon feed the baby. She swallowed and tolerated the 5 ml's, latched to breast after this, and stayed latched only for about a minutes, as per mom.  I was called by Bo Merinomartha S., RN, who requested a 24 nipple shield, since baby was trying to latch, but was unsuccessful. [place]. Martha to also set up DEP for mom . Mom knows to call for questions and concerns.    Maternal Data    Feeding Feeding Type: Breast Milk  LATCH Score/Interventions Latch: Too sleepy or reluctant, no latch achieved, no sucking elicited. Intervention(s): Adjust position;Assist with latch;Breast massage;Breast compression  Audible Swallowing: None (easily expresed colostrum, spoon fed baby 5 ml's) Intervention(s): Skin to skin;Hand expression  Type of Nipple: Everted at rest and after stimulation  Comfort (Breast/Nipple): Filling, red/small blisters or bruises, mild/mod discomfort  Problem noted: Filling (very firm breasts, mom feels they are filling)  Hold (Positioning): Assistance needed to correctly position infant at breast and maintain latch. Intervention(s): Breastfeeding basics reviewed;Support Pillows;Position options;Skin to skin  LATCH Score: 4  Lactation Tools Discussed/Used Pump Review: Setup, frequency, and cleaning;Milk Storage Initiated by:: clee Rn IBCLC Date initiated:: 08/10/16   Consult Status Consult Status: Follow-up Date: 08/11/16 Follow-up type: In-patient    Hailey [name] 08/10/2016, 4:54  PM

## 2016-08-10 NOTE — Clinical Social Work Maternal (Signed)
CLINICAL SOCIAL WORK MATERNAL/CHILD NOTE  Patient Details  Name: Hailey [name] MRN: [id number] Date of Birth: 05/24/1981  Date:  08/10/2016  Clinical Social Worker Initiating Note:  Kwesi Sangha Boyd-Gilyard Date/ Time Initiated:  08/10/16/1216     Child's Name:  Hailey [name]   Legal Guardian:  Mother   Need for Interpreter:  None   Date of Referral:  08/10/16     Reason for Referral:  Behavioral Health Issues, including SI    Referral Source:  Central Nursery   Address:  [address] [place] [address]  Phone number:  [phone]   Household Members:  Self, Minor Children, Spouse   Natural Supports (not living in the home):  Immediate Family, Friends, Spouse/significant other, Parent   Professional Supports: Therapist   Employment: Unemployed   Type of Work:     Education:  High school graduate   Financial Resources:  Private Insurance   Other Resources:      Cultural/Religious Considerations Which May Impact Care:  Per MOB's Face Sheet, MOB is Non-Denominational  Strengths:  Ability to meet basic needs , Pediatrician chosen , Home prepared for child , Understanding of illness   Risk Factors/Current Problems:  Mental Health Concerns    Cognitive State:  Alert , Insightful , Linear Thinking , Goal Oriented    Mood/Affect:  Bright , Calm , Happy , Interested    CSW Assessment:CSW met with MOB to complete an assessment for hx of anxiety and ADD.  When CSW arrived, MOB appeared to be bonding with infant, as evidence by engaging in skin to skin. FOB (Dennis Barfuss) was laying on the couch, and MOB gave CSW permission to meet with MOB while FOB was present.  Initially MOB reported to CSW that MOB was exhausted and was up most of the night attending to infant.  CSW normalized MOB's thoughts and feelings and inquired about MOB's support team.  MOB communicated that she has wealth of support whom will be willing to assist MOB and FOB if  needed.  CSW asked about MOB's MH hx and MOB acknowledged an hx of anxiety. MOB attributed MOB's anxious feeling during pregnancy to the 2 previous miscarriages. MOB initially was prescribed Zoloft, during this pregnancy and after about 4 months, MOB discontinued the use.  MOB communicated that MOB has felt good the past 5 months and is excited about being a mother again. CSW educated MOB about PPD. CSW informed MOB of possible supports and interventions to decrease PPD.  CSW also encouraged MOB to seek medical attention if needed for increased signs, symptoms for PPD. CSW offered MOB resources for outpatient counseling, and MOB declined.  MOB communicated that MOB knows whom to contact if a need arise. MOB had no other questions at this time and thanked CSW for meeting with MOB. CSW Plan/Description:  Information/Referral to Community Resources , Patient/Family Education , No Further Intervention Required/No Barriers to Discharge   [name], MSW, LCSW Clinical Social Work [phone]    [name], LCSW 08/10/2016, 12:19 PM    CLINICAL SOCIAL WORK MATERNAL/CHILD NOTE  Patient Details  Name: Hailey [name] MRN: [id number] Date of Birth: 07/11/1981  Date:  Feb 24, 2016  Clinical Social Worker Initiating Note:  [name] Date/ Time Initiated:  08/10/16/1216     Child's Name:  Hailey [name]   Legal Guardian:  Mother   Need for Interpreter:  None   Date of Referral:  Apr 06, 2016     Reason for Referral:  Behavioral Health Issues, including SI    Referral Source:  Central Nursery   Address:  [address] [place] [address]  Phone number:  [phone]   Household Members:  Self, Minor Children, Spouse   Natural Supports (not living in the home):  Immediate Family, Friends, Spouse/significant other, Artist Supports: Transport planner   Employment: Unemployed   Type of Work:     Education:  Database administrator Resources:  Multimedia programmer   Other Resources:      Cultural/Religious Considerations Which May Impact Care:  Per [employer] Sheet, MOB is Non-Denominational  Strengths:  Ability to meet basic needs , Pediatrician chosen , Home prepared for child , Understanding of illness   Risk Factors/Current Problems:  Mental Health Concerns    Cognitive State:  Alert , Insightful , Linear Thinking , Goal Oriented    Mood/Affect:  Bright , Calm , Happy , Interested    CSW Assessment:CSW met with MOB to complete an assessment for hx of anxiety and ADD.  When CSW arrived, MOB appeared to be bonding with infant, as evidence by engaging in skin to skin. FOB Simona Huh Minniefield) was laying on the couch, and MOB gave CSW permission to meet with MOB while FOB was present.  Initially MOB reported to CSW that MOB was exhausted and was up most of the night attending to infant.  CSW normalized MOB's thoughts and feelings and inquired about MOB's support team.  MOB communicated that she has wealth of support whom will be willing to assist MOB and FOB if  needed.  CSW asked about MOB's MH hx and MOB acknowledged an hx of anxiety. MOB attributed MOB's anxious feeling during pregnancy to the 2 previous miscarriages. MOB initially was prescribed Zoloft, during this pregnancy and after about 4 months, MOB discontinued the use.  MOB communicated that MOB has felt good the past 5 months and is excited about being a mother again. CSW educated MOB about PPD. CSW informed MOB of possible supports and interventions to decrease PPD.  CSW also encouraged MOB to seek medical attention if needed for increased signs, symptoms for PPD. CSW offered MOB resources for outpatient counseling, and MOB declined.  MOB communicated that MOB knows whom to contact if a need arise. MOB had no other questions at this time and thanked CSW for meeting with MOB. CSW Plan/Description:  Information/Referral to [employer] , [employer] , No Further Intervention Required/No Barriers to Discharge   [name], MSW, LCSW Clinical Social Work [phone]    Dimple Nanas, LCSW 04-Feb-2016, 12:19 PM

## 2016-08-10 NOTE — Progress Notes (Signed)
POSTOPERATIVE DAY # 1 S/P repeat CS  S:         Reports feeling ok - sore             Tolerating po intake / no nausea / no vomiting / no flatus / no BM             Bleeding is light             Pain controlled with motrin             Up ad lib / ambulatory/ voiding QS  Newborn breast feeding  O:  VS: BP (!) 102/59 (BP Location: Right Arm)   Pulse (!) 56   Temp 97.5 F (36.4 C) (Axillary)   Resp 20   LMP 06/02/2015   SpO2 100%   Breastfeeding? Unknown    LABS:               Recent Labs  08/08/16 1150 08/10/16 0604  WBC 12.6* 13.7*  HGB 12.0 9.2*  PLT 256 217               Bloodtype: --/--/O POS, O POS (10/16 1150)  Rubella: Immune (03/13 0000)              Declines Tdap - offer Flu prior to DC                                           I&O: Intake/Output      10/17 0701 - 10/18 0700 10/18 0701 - 10/19 0700   P.O. 400    I.V. 4062.5    Total Intake 4462.5     Urine 2850 650   Blood 600    Total Output 3450 650   Net +1012.5 -650                   Physical Exam:             Alert and Oriented X3  Lungs: Clear and unlabored  Heart: regular rate and rhythm / no mumurs  Abdomen: soft, non-tender, non-distended              Fundus: firm, non-tender, Ueven             Dressing intact              Incision:  approximated with suture / no erythema / no ecchymosis / no drainage  Perineum: intact  Lochia: light  Extremities: trace edema, no calf pain or tenderness  A:        POD # 1 S/P R-CS            Mild ABL anemia  P:        Routine postoperative care              Consider DC tomorrow   Marlinda MikeBAILEY, Samreet [place] CNM, MSN, [facility]AC[facility] 08/10/2016, 10:28 AM

## 2016-08-11 NOTE — Lactation Note (Signed)
This note was copied from a baby's chart. Lactation Consultation Note  Patient Name: Hailey [name] UEAVW'UToday's Date: 08/11/2016 Reason for consult: Follow-up assessment;Other (Comment) (Mom engorged.)  Baby 49 hours old. Mom finishing using DEBP when this LC entered the room. Mom able to express over an ounce of EBM using DEBP. Mom reports that baby just finished nursing earlier and did well at the breast. Mom states that she supplemented at that time using syringe and NS. Mom's breasts are engorged--and mom reports that this happened this early with her first child. Discussed engorgement prevention and treatment. Prepared ice bags for mom and enc mom to use 10-15 at a time--moving from outside to inner aspect of breasts. Demonstrated hand expression with additional milk flowing. Mom had been expressing her breasts too hard and was making herself sore. Enc mom to use ice for comfort and to shrink breast tissue so that she can remove milk easily and baby can latch better.   Enc mom to put baby to breast with cues and use NS as needed. Enc supplementing with EBM according to supplementation guidelines--continuing to supplement at breast with NS and curve-tipped syringe as needed. Enc mom to post-pump followed by hand expression. Mom given additional bottles for storage, and mom aware of EBM storage guideline and refrigerator for storage as needed. Enc mom to call for assistance as needed. Discussed assessment and interventions with patient's bedside RN, Rosey Batheresa. Maternal Data Has patient been taught Hand Expression?: Yes Does the patient have breastfeeding experience prior to this delivery?: Yes  Feeding    LATCH Score/Interventions Latch:  (HUNGRY.  MOM INST TO PULL OUT LOWER LIP.) Intervention(s): Adjust position;Assist with latch;Breast compression  Audible Swallowing: Spontaneous and intermittent Intervention(s): Hand expression  Type of Nipple: Everted at rest and after stimulation  (SHORT)  Comfort (Breast/Nipple): Filling, red/small blisters or bruises, mild/mod discomfort (ENGORGED W/O NIPPLE TENDERNESS) Problem noted: Engorgment Intervention(s): Hand expression;Reverse pressure  Problem noted: Mild/Moderate discomfort Interventions (Filling): Reverse pressure;Double electric pump;Hand pump Interventions (Mild/moderate discomfort): Hand expression;Reverse pressue  Hold (Positioning): No assistance needed to correctly position infant at breast. Intervention(s): Breastfeeding basics reviewed;Support Pillows;Position options     Lactation Tools Discussed/Used Tools: Nipple Dorris CarnesShields;Pump Nipple shield size: 24 Breast pump type: Double-Electric Breast Pump WIC Program: No Pump Review: Milk Storage   Consult Status Consult Status: Follow-up Date: 08/12/16 Follow-up type: In-patient    Sherlyn HayJennifer D Zanaiya Calabria 08/11/2016, 11:46 AM

## 2016-08-11 NOTE — Lactation Note (Addendum)
This note was copied from a baby's chart. Lactation Consultation Note  Patient Name: Girl Jana HalfKristen Boise JXBJY'NToday's Date: 08/11/2016    Mom is engorged & infant will not latch (to the bare breast or with a nipple shield). Mom had been told to just hand express and not pump, so Mom has spent the last hour or so just hand expressing. I've encouraged Mom to take a break & we will obtain some chilled cabbage leaves (for her to wear 15-20 min at a time TID). Shells were also put on Mom to promote leakage/help with nipple eversion. She is taking ibuprofen around the clock. "Francena HanlyStella" is being finger-fed by Dad in the interim.   Lurline HareRichey, Maclin Guerrette [facility] 08/11/2016, 9:32 PM

## 2016-08-11 NOTE — Progress Notes (Signed)
Patient ID: Hailey [name], female   DOB: Aug 23, 1981, 35 y.o.   MRN: [id number] Subjective: POD# 2 Information for the patient's newborn:  Keys, Girl Baxter HireKristen [[id number]][[id number]]  female   Baby name: Francena HanlyStella  Reports feeling sore and engorged. Feeding: breast Patient reports tolerating PO.  Breast symptoms: difficulty latching Pain controlled with PO meds Denies HA/SOB/C/P/N/V/dizziness. Flatus minimal. She reports vaginal bleeding as normal, without clots.  She is ambulating, urinating without difficulty.     Objective:   VS:    Vitals:   08/10/16 0915 08/10/16 1330 08/10/16 1735 08/11/16 0551  BP: (!) 102/59 109/67 111/63 109/68  Pulse: (!) 56 70 66 63  Resp: 20 16 18 18   Temp: 97.5 F (36.4 C) 98.5 F (36.9 C) 97.7 F (36.5 C) 98.2 F (36.8 C)  TempSrc: Axillary Oral Oral Oral  SpO2: 100% 100%      No intake or output data in the 24 hours ending 08/11/16 1600      Recent Labs  08/10/16 0604  WBC 13.7*  HGB 9.2*  HCT 26.3*  PLT 217     Blood type: --/--/O POS, O POS (10/16 1150)  Rubella: Immune (03/13 0000)     Physical Exam:  General: alert, cooperative and no distress CV: Regular rate and rhythm Resp: clear Abdomen: soft, nontender, normal bowel sounds Incision: clean, dry and intact Uterine Fundus: firm, below umbilicus, nontender Lochia: minimal Ext: edema trace, no cords or tenderness      Assessment/Plan: 35 y.o.   POD# 2. [id number]                  Principal Problem:   Postpartum care following repeat cesarean delivery (10/17) Active Problems:   Previous cesarean delivery affecting pregnancy ABL anemia  - start oral FE at DC  Doing well, stable.               Advance diet as tolerated Encourage rest when baby rests Breastfeeding support Encourage to ambulate, warm fluids to stimulate flatus.  Routine post-op care  Neta Mendsaniela C Miamarie Moll, CNM, MSN 08/11/2016, 4:00 PM

## 2016-08-12 ENCOUNTER — Ambulatory Visit: Payer: Self-pay

## 2016-08-12 MED ORDER — OXYCODONE-ACETAMINOPHEN 5-325 MG PO TABS: 1.0000 | ORAL_TABLET | ORAL | 0 refills | Status: AC | PRN

## 2016-08-12 MED ORDER — IBUPROFEN 600 MG PO TABS: 600.0000 mg | ORAL_TABLET | Freq: Four times a day (QID) | ORAL | 0 refills | Status: AC

## 2016-08-12 NOTE — Discharge Summary (Signed)
OB Discharge Summary  Patient Name: Hailey [name] DOB: [birth year] MRN: [id number]  Date of admission: 08/09/2016  Admitting diagnosis: Previous Cesarean Section Intrauterine pregnancy: 4482w1d      Date of discharge: 08/12/2016    Discharge diagnosis: Term Pregnancy Delivered - POD 3 s/p repeat CS  Prenatal history: G5P2032   EDC : 08/15/2016, by Other Basis  [facility]  Primary provider : Taavon Prenatal course complicated by previous CS  Prenatal Labs: ABO, Rh: --/--/O POS, O POS (10/16 1150)  Antibody: NEG (10/16 1150) Rubella: Immune (03/13 0000)  RPR: Non Reactive (10/16 1150)  HBsAg: Negative (03/13 0000)  HIV: Non-reactive (03/13 0000)  GBS: Negative (09/22 0000)                                    Hospital course:  Sceduled C/S   35 y.o. yo [id number] at 6982w1d was admitted to the hospital 08/09/2016 for scheduled cesarean section with the following indication:Elective Repeat.  Membrane Rupture Time/Date: 10:34 AM ,08/09/2016   Patient delivered a Viable infant.08/09/2016  Details of operation can be found in separate operative note.  Pateint had an uncomplicated postpartum course.  She is ambulating, tolerating a regular diet, passing flatus, and urinating well. Patient is discharged home in stable condition on  08/12/16        Delivering PROVIDER: Olivia MackieAAVON, RICHARD                                                            Complications: None  Newborn Data: Live born female  Birth Weight: 7 lb 2.6 oz (3250 g) APGAR: 9, 9  Baby Feeding: Breast Disposition:home with mother  Post partum procedures:none  Postpartum contraception: Not Discussed    Labs: Lab Results  Component Value Date   WBC 13.7 (H) 08/10/2016   HGB 9.2 (L) 08/10/2016   HCT 26.3 (L) 08/10/2016   MCV 91.0 08/10/2016   PLT 217 08/10/2016   CMP Latest Ref Rng & Units 12/26/2015  Glucose 65 - 99 mg/dL 147(W128(H)  BUN 6 - 20 mg/dL 12  Creatinine 2.950.44  - 1.00 mg/dL 6.210.74  Sodium 308135 - 657145 mmol/L 130(L)  Potassium 3.5 - 5.1 mmol/L 3.4(L)  Chloride 101 - 111 mmol/L 97(L)  CO2 22 - 32 mmol/L 21(L)  Calcium 8.9 - 10.3 mg/dL 9.2  Total Protein 6.5 - 8.1 g/dL 7.9  Total Bilirubin 0.3 - 1.2 mg/dL 8.4(O1.5(H)  Alkaline Phos 38 - 126 U/L 43  AST 15 - 41 U/L 28  ALT 14 - 54 U/L 17    Physical Exam @ time of discharge:  Vitals:   08/10/16 1735 08/11/16 0551 08/11/16 1700 08/12/16 0621  BP: 111/63 109/68 111/60 118/62  Pulse: 66 63 82 70  Resp: 18 18 18 18   Temp: 97.7 F (36.5 C) 98.2 F (36.8 C) 98.8 F (37.1 C) 98.5 F (36.9 C)  TempSrc: Oral Oral Oral Oral  SpO2:        General: alert, cooperative and no distress Lochia: appropriate Uterine Fundus: firm Perineum: intact Incision: Healing well with no significant drainage Extremities: DVT Evaluation: No evidence of DVT seen on physical exam.   Discharge instructions:  "  Baby and Me Booklet" and [facility] Booklet  Discharge Medications:    Medication List    STOP taking these medications   amphetamine-dextroamphetamine 15 MG tablet Commonly known as:  ADDERALL   diphenhydramine-acetaminophen 25-500 MG Tabs tablet Commonly known as:  TYLENOL PM   ondansetron 4 MG disintegrating tablet Commonly known as:  ZOFRAN ODT   Potassium Chloride ER 20 MEQ Tbcr   ranitidine 150 MG tablet Commonly known as:  ZANTAC     TAKE these medications   esomeprazole 40 MG capsule Commonly known as:  NEXIUM Take 40 mg by mouth daily.   ibuprofen 600 MG tablet Commonly known as:  ADVIL,MOTRIN Take 1 tablet (600 mg total) by mouth every 6 (six) hours.   oxyCODONE-acetaminophen 5-325 MG tablet Commonly known as:  PERCOCET/ROXICET Take 1 tablet by mouth every 4 (four) hours as needed (pain scale 4-7).   prenatal multivitamin Tabs tablet Take 1 tablet by mouth daily at 12 noon.   promethazine 25 MG tablet Commonly known as:  PHENERGAN Take 25 mg by mouth every 6 (six) hours as needed  for nausea or vomiting. What changed:  Another medication with the same name was removed. Continue taking this medication, and follow the directions you see here.       Diet: routine diet  Activity: Advance as tolerated. Pelvic rest x 6 weeks.   Follow up:6 weeks    Signed: Marlinda Mike CNM, MSN, [facility] 08/12/2016, 11:04 AM

## 2016-08-12 NOTE — Lactation Note (Signed)
This note was copied from a baby's chart. Lactation Consultation Note  Patient Name: Hailey [name] Darrow BMWUX'LToday's Date: 08/12/2016 Reason for consult: Follow-up assessment   With this mom and baby, now just over 413 days old. The baby has not fed in 3 hours, , but is very sleepy at the breast, with 24 nipple shield. We tried waking the baby, filling shileld with EBM, and after about 10 minutes of this, mom began finger feeding the baby, with SNS tubing taped to her finger. The baby took 2150 ml's of EBM well, and seemed satiated after this feeding. Dad helped with holding SNS bottle for mom, and parents work together great as a team. Pecola LeisureBaby is voiding a stooling well. I spoke to Dr. Margo AyeHall, and the parents with continue with SNS, both at breast with nipple shield, and finger feeding if baby will not suckle at breast. Mom to pump at least very 3 hours, until her engorgement is relieved, and to try reverse massage with coconut oil, massaging her breast toward her armpits, while lying flat in bed.  I told mom she may have to do this for 30-40 minutes, but she should see softening of her breasts, and be able to easier latch her baby. Mom's breast getting better, but still very firm, and difficult to compress.  Parents given resources on oral restriction, and I told mom that once her engorement goes away, the baby may be able to latch and feed well.I mand an o/p lactation appointment for mom for next Wednesday, 10/25 at 4 pm.  Mom knows to call for questions/concerns. Care of pump parts and SNS reviewed with parents, as well as how to assemble.    Maternal Data    Feeding Feeding Type: Breast Milk Length of feed: 25 min  LATCH Score/Interventions Latch: Repeated attempts needed to sustain latch, nipple held in mouth throughout feeding, stimulation needed to elicit sucking reflex. (used 24 nipple shiled, filled with EBM, with SNS tube in shiled) Intervention(s): Skin to skin;Teach feeding cues;Waking  techniques Intervention(s): Adjust position;Assist with latch  Audible Swallowing: None (baby not sucking on breast/nipple)  Type of Nipple: Everted at rest and after stimulation  Comfort (Breast/Nipple): Engorged, cracked, bleeding, large blisters, severe discomfort Problem noted: Engorgment Intervention(s): Reverse pressure;Ice;Other (comment) (DEP)  Problem noted: Filling Interventions (Filling): Reverse pressure;Massage;Frequent nursing;Hand pump;Double electric pump  Hold (Positioning): Assistance needed to correctly position infant at breast and maintain latch. Intervention(s): Breastfeeding basics reviewed;Support Pillows;Position options;Skin to skin  LATCH Score: 4  Lactation Tools Discussed/Used Nipple shield size: 24   Consult Status Consult Status: Complete Date: 08/17/16 Follow-up type: Out-patient    Alfred LevinsLee, Rahman Ferrall Anne 08/12/2016, 4:02 PM

## 2016-08-12 NOTE — Lactation Note (Signed)
This note was copied from a baby's chart. Lactation Consultation Note RN notified LC of mom being tearful and having difficulty BF. RN stated mom didn't want any advice she had to give in assisting BF. LC went into rm. Baby laying in mom's lap, FOB finger feeding colostrum w/curve tip syring. Mom tearful. Introduced myself. Mom looked frustrated and not communicable w/LC. Short answers saying she has had difficulty over the past 24 hrs. And she hasn't see improvement yet. LC stated it takes time, both you and baby are learning, then things will go better. Mom stated that's what they say. Commended mom on the amount of colostrum had had and was giving to the baby. She made acknowledgement to the comment. Asked if there was anything LC could do or help with. Mom stated no. She slightly waved her hand as if saying go away. Saying wer'e doing this and don't need anything right now. Encouraged to call for assistance or questions. Told her Lc would come by later to check on her.  Patient Name: Hailey [name] BMWUX'LToday's Date: 08/12/2016     Maternal Data    Feeding    LATCH Score/Interventions                      Lactation Tools Discussed/Used     Consult Status      [name], Taelon Bendorf G 08/12/2016, 6:44 AM

## 2016-08-12 NOTE — Lactation Note (Signed)
This note was copied from a baby's chart. Lactation Consultation Note  Patient Name: Hailey [name] MVHQI'OToday's Date: 08/12/2016 Reason for consult: Follow-up assessment  Infant began cueing to feed. Hailey [name] would not latch to the bare breast or with a nipple shield (Hailey [name]'s L nipple diameter may be too large for a size 24 nipple shield). Hailey [name] is engorged; I encouraged Hailey [name] to pump. She easily obtained 100mL within 15 minutes on the preemie setting. She is using a size 27 flange, which is the appropriate flange size for her. Hailey [name] is aware that she no longer has to use the preemie setting.  I finger-fed/cup-fed Stella, but she showed signs of contentment after just 9mL. Hailey [name] has gained 0.4 of an ounce over the last 24 hours.   Ice packs and shells were placed on Hailey [name]. Chilled cabbage is on ice in room. Hailey [name] knows to pump for 15-20 minutes q3h (or slightly more often if needed). Hailey [name] knows the goal is not to empty breasts, but to pump for comfort/have sufficient EBM for infant.   Hailey [name] will attempt latching on the R breast for the next feeding, as that nipple has a slightly smaller diameter that Hailey [name] may be able to latch onto. If she is unable to latch, Hailey [name] will continue finger-feeding.   Hailey [name], Hailey [name] Hailey [name] 08/12/2016, 12:41 AM

## 2016-08-12 NOTE — Progress Notes (Signed)
POSTOPERATIVE DAY # 3 S/P CS - repeat  S:         Reports feeling ready to go home             Tolerating po intake / no nausea / no vomiting / + flatus / no BM             Bleeding is light             Pain controlled with motrin and oxycodone             Up ad lib / ambulatory/ voiding QS  Newborn breast feeding  / girl  O:  VS: BP 118/62 (BP Location: Right Arm)   Pulse 70   Temp 98.5 F (36.9 C) (Oral)   Resp 18   LMP 06/02/2015   SpO2 100%   Breastfeeding? Unknown    LABS:               Recent Labs  08/10/16 0604  WBC 13.7*  HGB 9.2*  PLT 217               Bloodtype: --/--/O POS, O POS (10/16 1150)  Rubella: Immune (03/13 0000)                                Physical Exam:             Alert and Oriented X3  Lungs: Clear and unlabored  Heart: regular rate and rhythm / no mumurs  Abdomen: soft, non-tender, non-distended              Fundus: firm, non-tender, Ueven             Dressing intact              Incision:  approximated with suture / no erythema / no ecchymosis / no drainage  Perineum: intact  Lochia: light  Extremities: no edema, no calf pain or tenderness, neg Homans  A:        POD # 3 S/P CS             P:        Routine postoperative care              DC home     Marlinda MikeBAILEY, Jai Bear CNM, MSN, [facility] 08/12/2016, 10:21 AM

## 2016-08-13 ENCOUNTER — Telehealth: Payer: Self-pay

## 2016-08-13 NOTE — Telephone Encounter (Signed)
Entered in Error

## 2016-08-17 ENCOUNTER — Ambulatory Visit: Payer: Self-pay

## 2016-08-17 NOTE — Lactation Note (Signed)
This note was copied from a baby's chart. Lactation Consult: weight today 3094 g 6- 13.2 oz here today because Hailey [name] is having a hard time getting latched to the breast. On and off the breast, very fussy. Mom's breasts are very full. Reports engorgement is improving. Pumped for a few minutes to soften breast. Still on and off the breast. Finger fed some to calm her. Finally did latch for about 5 min with SNS at breast. Mom pleased she latched on. Some chewing noted while she was at the breast. Encouragement given. Mom is going to "stick with it until she learns" Finger fed rest of feeding. Mom does not want to give bottle because her last baby stopped breast feeding at 10 months because mom was gone a few days and then would not latch on. Baby did spit up part of feeding. Did not reweigh because of finger feeding and spitting up. Mom has not been using NS. No further questions at present. Offered another OP appointment but mom states she will call if needs another one.   Mother's reason for visit:  Help with breast feeding Visit Type:  Feeding assessment Appointment Notes:  P2, Experienced BF, NS in hospital Consult:  Initial Lactation Consultant:  Audry RilesWeeks, Tomislav Micale D  ________________________________________________________________________    ________________________________________________________________________  Mother's Name: Hailey MillerStella Kate [name] Type of delivery:  C-Section, Low Transverse Breastfeeding Experience:  !0 months with first baby Maternal Medications:  Percocet  ________________________________________________________________________  Breastfeeding History (Post Discharge)  Frequency of breastfeeding:  Attempts but few latches Duration of feeding:  5 min  Supplementation  Formula:  Volume 0 ml        Breastmilk:  Volume 70-90 ml Frequency:  q 2-3 hr  Method:  Supplmental nutrition system, finger feeding   Pumping  Type of pump:  Medela pump in style Frequency:  q  2 hours Volume: 90+ ml    Infant Intake and Output Assessment  Voids:  8+ in 24 hrs.  Color:  Clear yellow Stools:  6+ in 24 hrs.  Color:  Yellow  ________________________________________________________________________  Maternal Breast Assessment  Breast:  Full Nipple:  Erect _______________________________________________________________________ Feeding Assessment/Evaluation  Initial feeding assessment:  Infant's oral assessment: Mom states Ped looked at baby's tongue and says she does not have tongue tie  Positioning:  Football Left breast  LATCH documentation:  Latch:  1 = Repeated attempts needed to sustain latch, nipple held in mouth throughout feeding, stimulation needed to elicit sucking reflex.  Audible swallowing:  1 = A few with stimulation  Type of nipple:  2 = Everted at rest and after stimulation  Comfort (Breast/Nipple):  1 = Filling, red/small blisters or bruises, mild/mod discomfort  Hold (Positioning):  2 = No assistance needed to correctly position infant at breast  LATCH score:  7  Attached assessment:  Deep  Lips flanged:  Yes.    Lips untucked:  Yes.    Suck assessment:  Nutritive and Nonnutritive  Tools:  Supplemental nutrition system Instructed on use and cleaning of tool:  No.  Pre-feed weight:  3094 g 6- 13.2 oz Post-feed weight:did not reweigh because baby was mostly finger fed Amount supplemented:  60 ml    Total amount pumped post feed:  120 ml

## 2017-03-07 IMAGING — US US OB TRANSVAGINAL
1 series · 15 of 28 positions shown · non-contrast
Comparison: None.

CLINICAL DATA: First trimester pregnancy with vaginal bleeding

EXAM:
OBSTETRIC <14 WK US AND TRANSVAGINAL OB US
TECHNIQUE: Both transabdominal and transvaginal ultrasound examinations were
performed for complete evaluation of the gestation as well as the
maternal uterus, adnexal regions, and pelvic cul-de-sac.
Transvaginal technique was performed to assess early pregnancy.

[Series 1: us ob transvaginal · 15 of 37 slices shown]
[im 1/37]
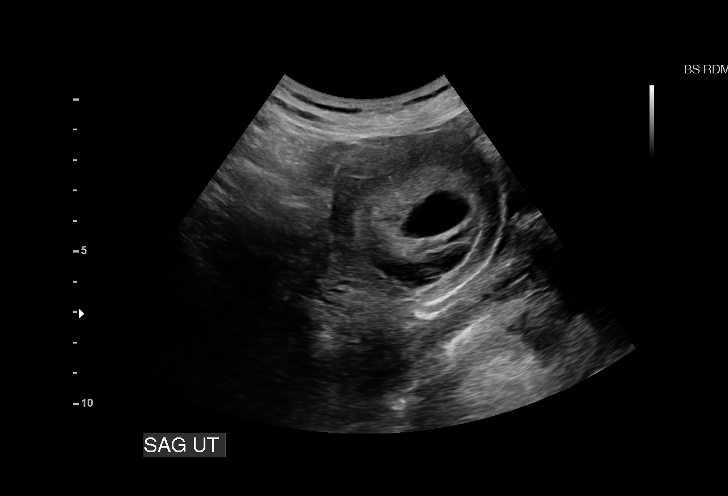
[im 3/37]
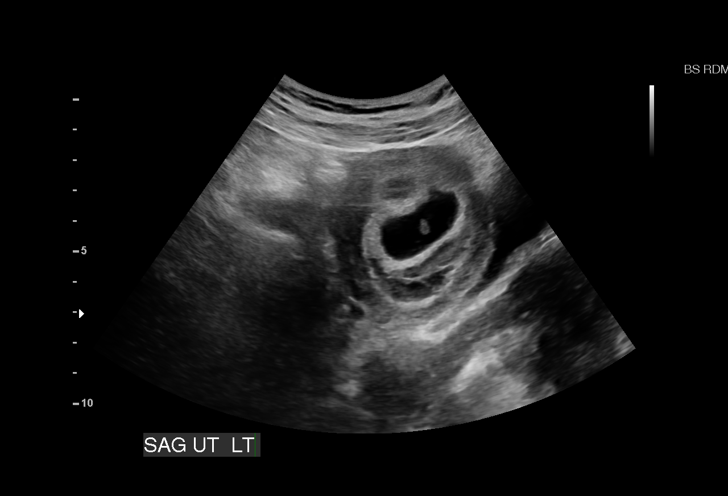
[im 6/37]
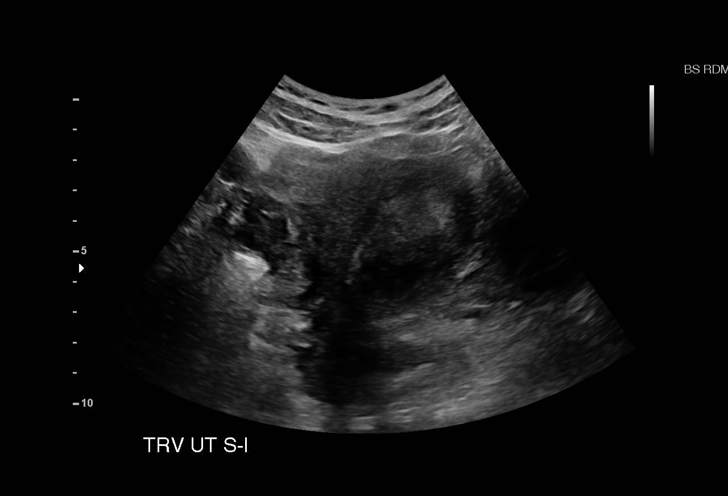
[im 9/37]
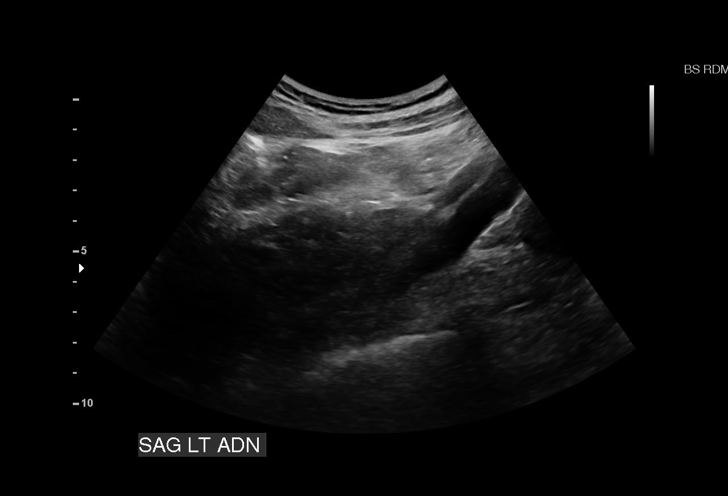
[im 11/37]
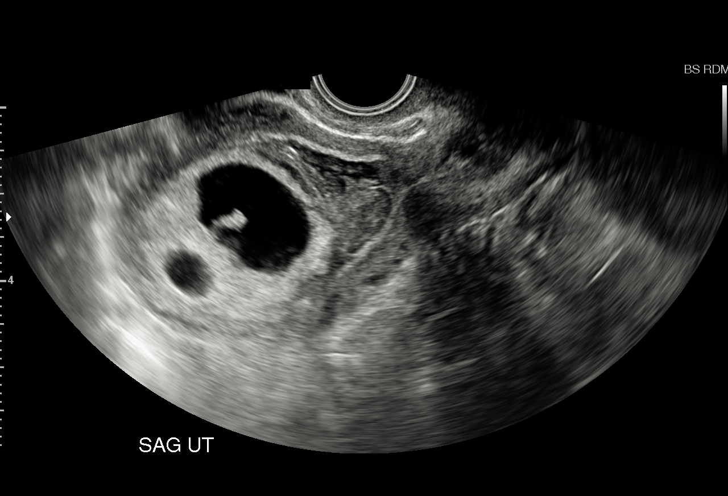
[im 14/37]
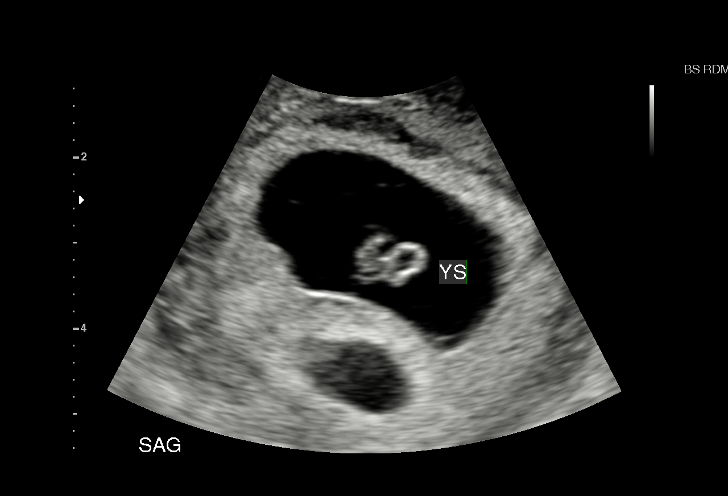
[im 17/37]
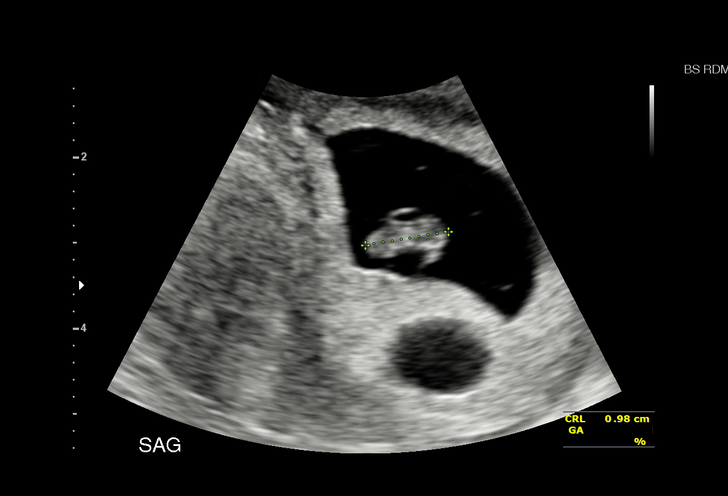
[im 19/37]
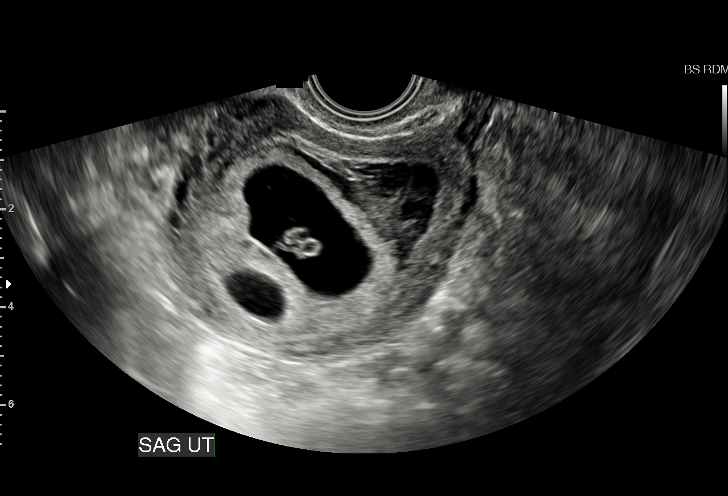
[im 21/37]
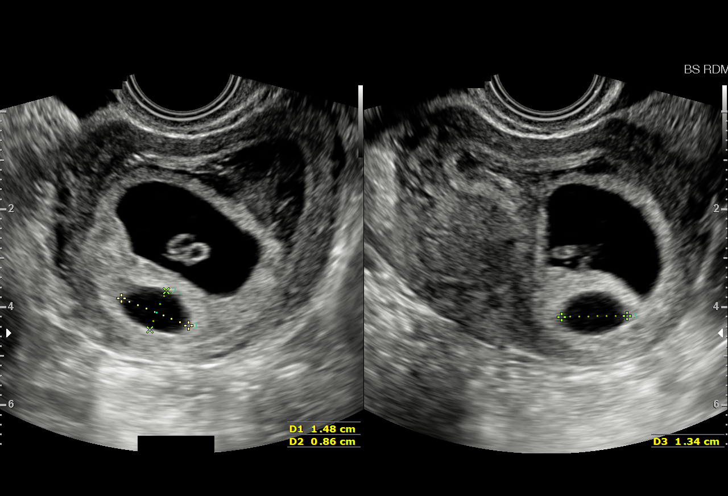
[im 23/37]
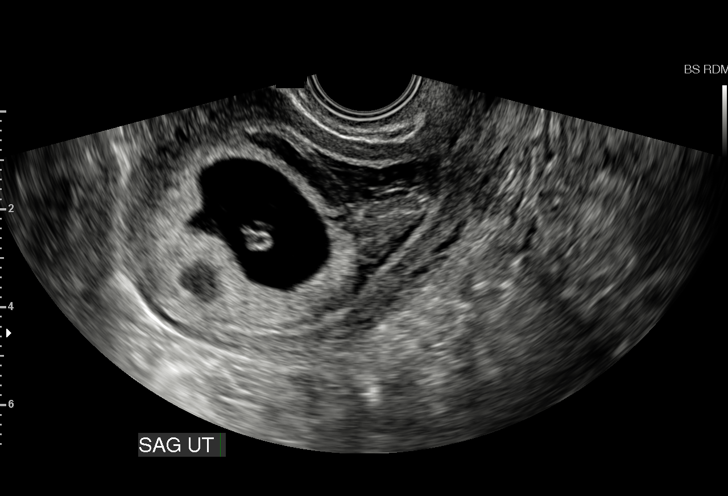
[im 26/37]
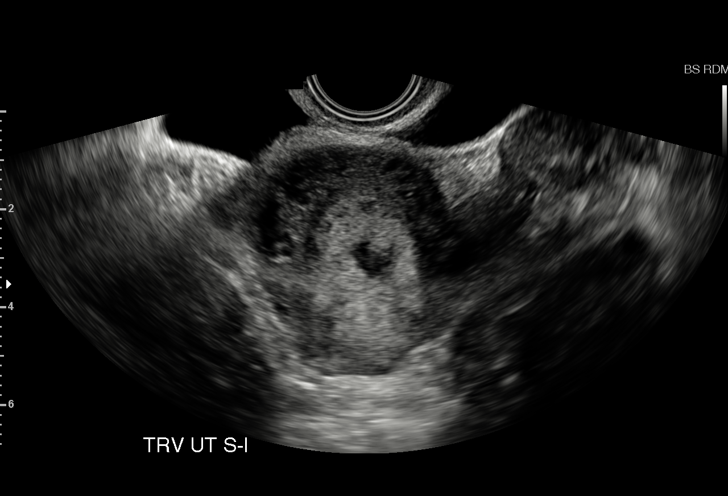
[im 29/37]
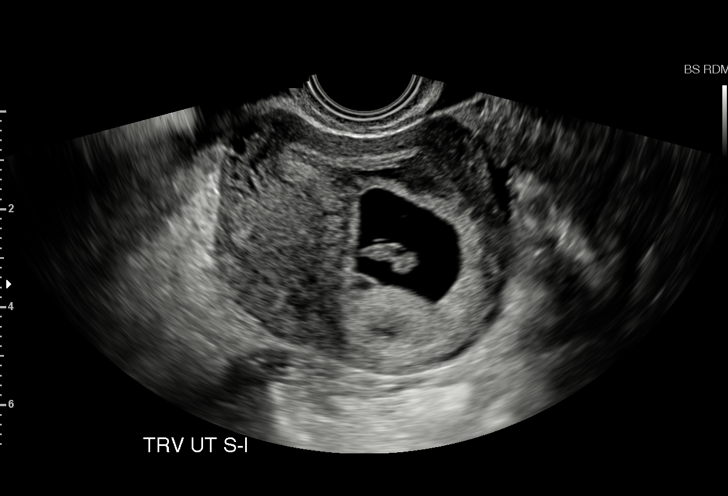
[im 31/37]
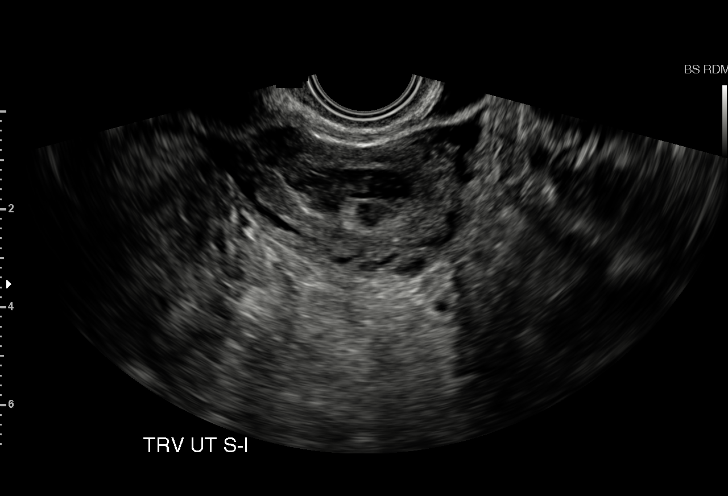
[im 34/37]
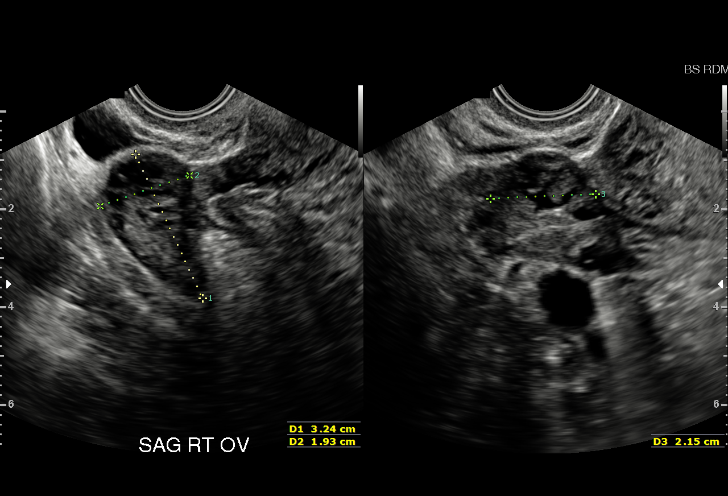
[im 37/37]
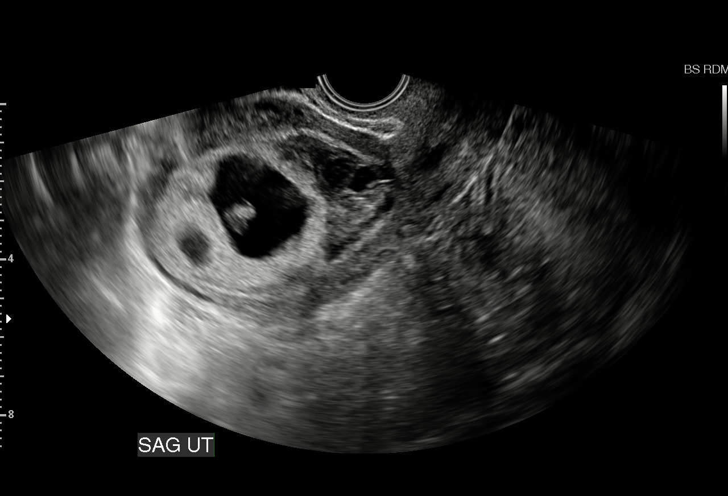

[15 of 28 positions shown; findings below may reference images not displayed]

FINDINGS: Intrauterine gestational sac: Visualized but somewhat irregular in
contour

Yolk sac:  Present

Embryo:  Present

Cardiac Activity: Present

Heart Rate: 142  bpm

CRL:  10  mm   7 w   1 d                  US EDC: 08/12/2016

Subchorionic hemorrhage: Large subchorionic hemorrhage measuring 35
x 34 x 39 mm. A second anechoic oval structure measures 1.5 cm and
is located in the fundus, with no internal soft tissue or
irregularity. This is located well away from the larger subchorionic
hemorrhage.

Maternal uterus/adnexae: Both ovaries are normal.
IMPRESSION: Mildly irregular gestational sac with live intrauterine gestation
and large subchorionic hemorrhage. Second anechoic collection could
represent another smaller prior hemorrhage or possibly an involuting
second gestational sac (disappearing twin.)

## 2018-01-26 ENCOUNTER — Ambulatory Visit: Payer: Self-pay | Admitting: *Deleted

## 2018-01-26 NOTE — Telephone Encounter (Signed)
I returned her call.   She was in the car leaving town for a weekend trip.   She is [redacted] weeks pregnant.   I suggested she call her OB-GYN for suggestions on what to take for her head congestion and cough since she was pregnant.   She does not have insurance right now so the OB-GYN she normally goes to does not accept Medicaid which she has applied for.   She is also trying to get private insurance so she can stay with her OB-GYN doctor (he delivered her other children).  After triaging her she decided she wanted to wait until Monday when she returns from her trip and call us back if she still feels bad.  She asked how much it would be to [facility] [place] since she does not have insurance.   [facility] information about the office visit starting at $180.00 but that without insurance she would get a 55% discount off the total bill depending on labs, etc that were done/ordered.  She stated she could not afford that right now.  I instructed her to go to an urgent care center or local ED where she is traveling to if her symptoms become worse or she develops fever and body aches/chills especially since she is pregnant.   She verbalized understanding and was agreeable to this plan.   She will call us back on Monday if still sick when she returns to town. Reason for Disposition . [1] Sinus congestion (pressure, fullness) AND [2] present > 10 days    [redacted] weeks pregnant and is very congested with pressure in her head.  Answer Assessment - Initial Assessment Questions 1. ONSET: "When did the nasal discharge start?"      It started 6 days ago.   I'm [redacted] weeks pregnant.  I'm so congested in my head.  I have a lot of pressure in my face. 2. AMOUNT: "How much discharge is there?"      It's not coming out.   I'm just having a lot of pressure. 3. COUGH: "Do you have a cough?" If yes, ask: "Describe the color of your sputum" (clear, white, yellow, green)     Yes.   Sometimes a little  green sputum will come out of occasion. 4. RESPIRATORY DISTRESS: "Describe your breathing."      I'm out of breath but I was like this with my first pregnancy too. 5. FEVER: "Do you have a fever?" If so, ask: "What is your temperature, how was it measured, and when did it start?"     No 6. SEVERITY: "Overall, how bad are you feeling right now?" (e.g., doesn't interfere with normal activities, staying home from school/work, staying in bed)      No body aches.   Mainly my face is full of pressure. 7. OTHER SYMPTOMS: "Do you have any other symptoms?" (e.g., sore throat, earache, wheezing, vomiting)     No sore throat, or earache.   No V/D 8. PREGNANCY: "Is there any chance you are pregnant?" "When was your last menstrual period?"     Yes   7 weeks now.   I'm trying to get privette insurance so I can stay with him but he doesn't take Medicaid.   I just found all this out.  Protocols used: COMMON COLD-A-AH

## 2018-06-28 ENCOUNTER — Telehealth: Payer: Self-pay | Admitting: Physician Assistant

## 2018-06-28 NOTE — Telephone Encounter (Signed)
Copied from CRM [id number]. Topic: General - Other >> Jun 27, 2018  5:40 PM Trula Slade wrote: Reason for CRM:   Patient would like to ask the provider, [name], and she would like to know where she is going because she wants to follow her to her new facility. Please call.

## 2018-06-28 NOTE — Telephone Encounter (Signed)
LMOVM with Sarah's forwarding info.
